# Patient Record
Sex: Female | Born: 1940 | Race: Black or African American | Hispanic: No | Marital: Married | Smoking: Never smoker
Health system: Southern US, Community
[De-identification: ages and names within clinical notes are randomized; demographics above are authoritative.]

## PROBLEM LIST (undated history)

## (undated) DIAGNOSIS — N951 Menopausal and female climacteric states: Secondary | ICD-10-CM

## (undated) DIAGNOSIS — M810 Age-related osteoporosis without current pathological fracture: Secondary | ICD-10-CM

## (undated) DIAGNOSIS — M545 Low back pain: Secondary | ICD-10-CM

## (undated) DIAGNOSIS — E119 Type 2 diabetes mellitus without complications: Secondary | ICD-10-CM

## (undated) DIAGNOSIS — M1990 Unspecified osteoarthritis, unspecified site: Secondary | ICD-10-CM

## (undated) DIAGNOSIS — E785 Hyperlipidemia, unspecified: Secondary | ICD-10-CM

## (undated) DIAGNOSIS — D126 Benign neoplasm of colon, unspecified: Secondary | ICD-10-CM

## (undated) DIAGNOSIS — M25559 Pain in unspecified hip: Secondary | ICD-10-CM

## (undated) DIAGNOSIS — I10 Essential (primary) hypertension: Secondary | ICD-10-CM

## (undated) DIAGNOSIS — K5730 Diverticulosis of large intestine without perforation or abscess without bleeding: Secondary | ICD-10-CM

## (undated) DIAGNOSIS — K219 Gastro-esophageal reflux disease without esophagitis: Secondary | ICD-10-CM

## (undated) DIAGNOSIS — H269 Unspecified cataract: Secondary | ICD-10-CM

## (undated) HISTORY — DX: Low back pain: M54.5

## (undated) HISTORY — DX: Gastro-esophageal reflux disease without esophagitis: K21.9

## (undated) HISTORY — PX: BREAST BIOPSY: SHX20

## (undated) HISTORY — DX: Menopausal and female climacteric states: N95.1

## (undated) HISTORY — DX: Hyperlipidemia, unspecified: E78.5

## (undated) HISTORY — PX: TUBAL LIGATION: SHX77

## (undated) HISTORY — DX: Diverticulosis of large intestine without perforation or abscess without bleeding: K57.30

## (undated) HISTORY — DX: Pain in unspecified hip: M25.559

## (undated) HISTORY — DX: Benign neoplasm of colon, unspecified: D12.6

## (undated) HISTORY — DX: Essential (primary) hypertension: I10

## (undated) HISTORY — DX: Type 2 diabetes mellitus without complications: E11.9

## (undated) HISTORY — DX: Age-related osteoporosis without current pathological fracture: M81.0

---

## 1998-06-16 ENCOUNTER — Other Ambulatory Visit: Admission: RE | Admit: 1998-06-16 | Discharge: 1998-06-16 | Payer: Self-pay | Admitting: Obstetrics & Gynecology

## 1998-08-14 ENCOUNTER — Ambulatory Visit (HOSPITAL_COMMUNITY): Admission: RE | Admit: 1998-08-14 | Discharge: 1998-08-14 | Payer: Self-pay | Admitting: Obstetrics & Gynecology

## 1999-10-28 ENCOUNTER — Other Ambulatory Visit: Admission: RE | Admit: 1999-10-28 | Discharge: 1999-10-28 | Payer: Self-pay | Admitting: Obstetrics & Gynecology

## 1999-10-29 ENCOUNTER — Encounter (INDEPENDENT_AMBULATORY_CARE_PROVIDER_SITE_OTHER): Payer: Self-pay

## 1999-10-29 ENCOUNTER — Other Ambulatory Visit: Admission: RE | Admit: 1999-10-29 | Discharge: 1999-10-29 | Payer: Self-pay | Admitting: Obstetrics & Gynecology

## 2000-08-08 ENCOUNTER — Encounter: Admission: RE | Admit: 2000-08-08 | Discharge: 2000-11-06 | Payer: Self-pay | Admitting: Endocrinology

## 2000-11-08 ENCOUNTER — Other Ambulatory Visit: Admission: RE | Admit: 2000-11-08 | Discharge: 2000-11-08 | Payer: Self-pay | Admitting: Obstetrics & Gynecology

## 2002-02-27 ENCOUNTER — Other Ambulatory Visit: Admission: RE | Admit: 2002-02-27 | Discharge: 2002-02-27 | Payer: Self-pay | Admitting: Obstetrics & Gynecology

## 2003-05-14 ENCOUNTER — Other Ambulatory Visit: Admission: RE | Admit: 2003-05-14 | Discharge: 2003-05-14 | Payer: Self-pay | Admitting: Obstetrics & Gynecology

## 2004-05-18 ENCOUNTER — Other Ambulatory Visit: Admission: RE | Admit: 2004-05-18 | Discharge: 2004-05-18 | Payer: Self-pay | Admitting: Obstetrics & Gynecology

## 2005-04-13 ENCOUNTER — Ambulatory Visit: Payer: Self-pay | Admitting: Endocrinology

## 2005-04-19 ENCOUNTER — Ambulatory Visit: Payer: Self-pay | Admitting: Endocrinology

## 2005-06-22 ENCOUNTER — Other Ambulatory Visit: Admission: RE | Admit: 2005-06-22 | Discharge: 2005-06-22 | Payer: Self-pay | Admitting: Obstetrics & Gynecology

## 2006-05-03 ENCOUNTER — Ambulatory Visit: Payer: Self-pay | Admitting: Endocrinology

## 2006-05-03 LAB — CONVERTED CEMR LAB
AST: 24 units/L (ref 0–37)
Calcium: 9.5 mg/dL (ref 8.4–10.5)
Chloride: 108 meq/L (ref 96–112)
Creatinine, Ser: 1 mg/dL (ref 0.4–1.2)
GFR calc non Af Amer: 60 mL/min
Glomerular Filtration Rate, Af Am: 72 mL/min/{1.73_m2}
HCT: 38.3 % (ref 36.0–46.0)
Ketones, ur: NEGATIVE mg/dL
LDL Cholesterol: 75 mg/dL (ref 0–99)
Platelets: 288 10*3/uL (ref 150–400)
RDW: 16.1 % — ABNORMAL HIGH (ref 11.5–14.6)
Specific Gravity, Urine: 1.02 (ref 1.000–1.03)
TSH: 1.28 microintl units/mL (ref 0.35–5.50)
Total Bilirubin: 0.8 mg/dL (ref 0.3–1.2)
Total Protein, Urine: NEGATIVE mg/dL
Total Protein: 6.6 g/dL (ref 6.0–8.3)
Triglyceride fasting, serum: 45 mg/dL (ref 0–149)
VLDL: 9 mg/dL (ref 0–40)
WBC: 6.6 10*3/uL (ref 4.5–10.5)
pH: 6 (ref 5.0–8.0)

## 2006-05-10 ENCOUNTER — Ambulatory Visit: Payer: Self-pay | Admitting: Endocrinology

## 2006-05-18 ENCOUNTER — Ambulatory Visit: Payer: Self-pay | Admitting: Internal Medicine

## 2006-07-05 ENCOUNTER — Ambulatory Visit: Payer: Self-pay | Admitting: Gastroenterology

## 2006-07-20 ENCOUNTER — Encounter: Payer: Self-pay | Admitting: Gastroenterology

## 2006-07-20 ENCOUNTER — Ambulatory Visit: Payer: Self-pay | Admitting: Gastroenterology

## 2006-07-20 DIAGNOSIS — K5730 Diverticulosis of large intestine without perforation or abscess without bleeding: Secondary | ICD-10-CM | POA: Insufficient documentation

## 2006-07-20 HISTORY — DX: Diverticulosis of large intestine without perforation or abscess without bleeding: K57.30

## 2006-07-20 LAB — HM COLONOSCOPY

## 2007-04-19 ENCOUNTER — Encounter: Payer: Self-pay | Admitting: *Deleted

## 2007-04-19 DIAGNOSIS — M810 Age-related osteoporosis without current pathological fracture: Secondary | ICD-10-CM | POA: Insufficient documentation

## 2007-04-19 DIAGNOSIS — I10 Essential (primary) hypertension: Secondary | ICD-10-CM | POA: Insufficient documentation

## 2007-04-19 DIAGNOSIS — E119 Type 2 diabetes mellitus without complications: Secondary | ICD-10-CM | POA: Insufficient documentation

## 2007-04-19 DIAGNOSIS — E785 Hyperlipidemia, unspecified: Secondary | ICD-10-CM

## 2007-04-19 DIAGNOSIS — N951 Menopausal and female climacteric states: Secondary | ICD-10-CM

## 2007-04-19 HISTORY — DX: Type 2 diabetes mellitus without complications: E11.9

## 2007-04-19 HISTORY — DX: Menopausal and female climacteric states: N95.1

## 2007-04-19 HISTORY — DX: Hyperlipidemia, unspecified: E78.5

## 2007-04-19 HISTORY — DX: Age-related osteoporosis without current pathological fracture: M81.0

## 2007-04-19 HISTORY — DX: Essential (primary) hypertension: I10

## 2007-05-08 ENCOUNTER — Ambulatory Visit: Payer: Self-pay | Admitting: Endocrinology

## 2007-05-08 LAB — CONVERTED CEMR LAB
ALT: 12 units/L (ref 0–35)
AST: 18 units/L (ref 0–37)
Albumin: 3.5 g/dL (ref 3.5–5.2)
Alkaline Phosphatase: 63 units/L (ref 39–117)
BUN: 12 mg/dL (ref 6–23)
Bilirubin Urine: NEGATIVE
Bilirubin, Direct: 0.1 mg/dL (ref 0.0–0.3)
Calcium: 9.4 mg/dL (ref 8.4–10.5)
Cholesterol: 184 mg/dL (ref 0–200)
Creatinine, Ser: 0.9 mg/dL (ref 0.4–1.2)
Creatinine,U: 62.5 mg/dL
Crystals: NEGATIVE
GFR calc non Af Amer: 67 mL/min
HDL: 59.7 mg/dL (ref >39.0)
Hemoglobin: 12.8 g/dL (ref 12.0–15.0)
LDL Cholesterol: 113 mg/dL — ABNORMAL HIGH (ref 0–99)
Leukocytes, UA: NEGATIVE
Lymphocytes Relative: 31.7 % (ref 12.0–46.0)
MCHC: 33.6 g/dL (ref 30.0–36.0)
MCV: 77 fL — ABNORMAL LOW (ref 78.0–100.0)
Monocytes Relative: 9.6 % (ref 3.0–11.0)
Nitrite: NEGATIVE
Platelets: 221 10*3/uL (ref 150–400)
RDW: 14.5 % (ref 11.5–14.6)
Total Bilirubin: 0.8 mg/dL (ref 0.3–1.2)
Total CHOL/HDL Ratio: 3.1
Total Protein, Urine: NEGATIVE mg/dL
Total Protein: 6.7 g/dL (ref 6.0–8.3)
VLDL: 12 mg/dL (ref 0–40)

## 2007-05-17 ENCOUNTER — Ambulatory Visit: Payer: Self-pay | Admitting: Endocrinology

## 2007-06-18 ENCOUNTER — Ambulatory Visit: Payer: Self-pay | Admitting: Gastroenterology

## 2007-06-26 ENCOUNTER — Ambulatory Visit: Payer: Self-pay | Admitting: Gastroenterology

## 2007-08-21 DIAGNOSIS — K219 Gastro-esophageal reflux disease without esophagitis: Secondary | ICD-10-CM

## 2007-08-21 DIAGNOSIS — D126 Benign neoplasm of colon, unspecified: Secondary | ICD-10-CM

## 2007-08-21 HISTORY — DX: Gastro-esophageal reflux disease without esophagitis: K21.9

## 2007-08-21 HISTORY — DX: Benign neoplasm of colon, unspecified: D12.6

## 2007-11-30 ENCOUNTER — Encounter: Payer: Self-pay | Admitting: Endocrinology

## 2008-05-12 ENCOUNTER — Ambulatory Visit: Payer: Self-pay | Admitting: Endocrinology

## 2008-05-14 LAB — CONVERTED CEMR LAB
AST: 18 units/L (ref 0–37)
Albumin: 3.5 g/dL (ref 3.5–5.2)
Alkaline Phosphatase: 61 units/L (ref 39–117)
BUN: 9 mg/dL (ref 6–23)
Bilirubin, Direct: 0.1 mg/dL (ref 0.0–0.3)
CO2: 29 meq/L (ref 19–32)
Calcium: 9.7 mg/dL (ref 8.4–10.5)
Chloride: 107 meq/L (ref 96–112)
Cholesterol: 259 mg/dL (ref 0–200)
Creatinine, Ser: 0.9 mg/dL (ref 0.4–1.2)
Direct LDL: 159.2 mg/dL
Eosinophils Relative: 1.3 % (ref 0.0–5.0)
GFR calc non Af Amer: 67 mL/min
Ketones, ur: NEGATIVE mg/dL
Monocytes Relative: 8.2 % (ref 3.0–12.0)
Specific Gravity, Urine: 1.01 (ref 1.000–1.03)
TSH: 1.22 microintl units/mL (ref 0.35–5.50)
Total CHOL/HDL Ratio: 4.6
Total Protein: 6.7 g/dL (ref 6.0–8.3)
Triglycerides: 85 mg/dL (ref 0–149)
Urine Glucose: NEGATIVE mg/dL
Urobilinogen, UA: 0.2 (ref 0.0–1.0)
pH: 6.5 (ref 5.0–8.0)

## 2008-05-19 ENCOUNTER — Ambulatory Visit: Payer: Self-pay | Admitting: Endocrinology

## 2008-05-27 ENCOUNTER — Encounter: Payer: Self-pay | Admitting: Endocrinology

## 2008-05-27 ENCOUNTER — Ambulatory Visit: Payer: Self-pay | Admitting: Internal Medicine

## 2008-08-11 ENCOUNTER — Ambulatory Visit: Payer: Self-pay | Admitting: Endocrinology

## 2008-08-11 LAB — CONVERTED CEMR LAB
ALT: 11 units/L (ref 0–35)
Bilirubin, Direct: 0.1 mg/dL (ref 0.0–0.3)
Cholesterol: 165 mg/dL (ref 0–200)
HDL: 55 mg/dL (ref >39.0)
Hgb A1c MFr Bld: 6.2 % — ABNORMAL HIGH (ref 4.6–6.0)
LDL Cholesterol: 96 mg/dL (ref 0–99)
Total Protein: 7.1 g/dL (ref 6.0–8.3)
Triglycerides: 69 mg/dL (ref 0–149)
VLDL: 14 mg/dL (ref 0–40)

## 2008-08-18 ENCOUNTER — Ambulatory Visit: Payer: Self-pay | Admitting: Endocrinology

## 2008-12-01 ENCOUNTER — Encounter: Payer: Self-pay | Admitting: Endocrinology

## 2009-05-15 ENCOUNTER — Ambulatory Visit: Payer: Self-pay | Admitting: Endocrinology

## 2009-05-15 LAB — CONVERTED CEMR LAB
ALT: 15 units/L (ref 0–35)
Albumin: 3.5 g/dL (ref 3.5–5.2)
Alkaline Phosphatase: 75 units/L (ref 39–117)
BUN: 15 mg/dL (ref 6–23)
Bilirubin, Direct: 0.1 mg/dL (ref 0.0–0.3)
CO2: 29 meq/L (ref 19–32)
Calcium: 9.4 mg/dL (ref 8.4–10.5)
Cholesterol: 177 mg/dL (ref 0–200)
Creatinine, Ser: 1 mg/dL (ref 0.4–1.2)
Eosinophils Relative: 1.1 % (ref 0.0–5.0)
GFR calc non Af Amer: 71.32 mL/min (ref >60)
Hemoglobin, Urine: NEGATIVE
Hemoglobin: 13.6 g/dL (ref 12.0–15.0)
Hgb A1c MFr Bld: 6.4 % (ref 4.6–6.5)
Lymphocytes Relative: 24.8 % (ref 12.0–46.0)
MCHC: 33.4 g/dL (ref 30.0–36.0)
RDW: 14.4 % (ref 11.5–14.6)
Sodium: 144 meq/L (ref 135–145)
Specific Gravity, Urine: 1.02 (ref 1.000–1.030)
TSH: 1.56 microintl units/mL (ref 0.35–5.50)
Total Protein, Urine: NEGATIVE mg/dL
Triglycerides: 45 mg/dL (ref 0.0–149.0)
Urobilinogen, UA: 0.2 (ref 0.0–1.0)

## 2009-05-21 ENCOUNTER — Ambulatory Visit: Payer: Self-pay | Admitting: Endocrinology

## 2009-05-21 DIAGNOSIS — M545 Low back pain, unspecified: Secondary | ICD-10-CM

## 2009-05-21 HISTORY — DX: Low back pain, unspecified: M54.50

## 2009-08-25 LAB — CONVERTED CEMR LAB: Pap Smear: NORMAL

## 2009-12-07 ENCOUNTER — Encounter: Payer: Self-pay | Admitting: Endocrinology

## 2010-05-17 ENCOUNTER — Ambulatory Visit: Payer: Self-pay | Admitting: Endocrinology

## 2010-05-17 LAB — CONVERTED CEMR LAB
ALT: 16 units/L (ref 0–35)
AST: 20 units/L (ref 0–37)
Alkaline Phosphatase: 71 units/L (ref 39–117)
BUN: 11 mg/dL (ref 6–23)
Basophils Absolute: 0 10*3/uL (ref 0.0–0.1)
Basophils Relative: 0.7 % (ref 0.0–3.0)
Chloride: 101 meq/L (ref 96–112)
Creatinine, Ser: 0.9 mg/dL (ref 0.4–1.2)
GFR calc non Af Amer: 85.77 mL/min (ref >60)
Glucose, Bld: 123 mg/dL — ABNORMAL HIGH (ref 70–99)
HCT: 41.9 % (ref 36.0–46.0)
Hemoglobin: 14.1 g/dL (ref 12.0–15.0)
Ketones, ur: NEGATIVE mg/dL
LDL Cholesterol: 84 mg/dL (ref 0–99)
Leukocytes, UA: NEGATIVE
Lymphocytes Relative: 22.9 % (ref 12.0–46.0)
MCHC: 33.6 g/dL (ref 30.0–36.0)
MCV: 79.9 fL (ref 78.0–100.0)
Monocytes Relative: 7.3 % (ref 3.0–12.0)
Neutro Abs: 4.1 10*3/uL (ref 1.4–7.7)
Neutrophils Relative %: 67.8 % (ref 43.0–77.0)
RDW: 15.4 % — ABNORMAL HIGH (ref 11.5–14.6)
Total Bilirubin: 0.6 mg/dL (ref 0.3–1.2)
Urobilinogen, UA: 0.2 (ref 0.0–1.0)
WBC: 6.1 10*3/uL (ref 4.5–10.5)

## 2010-05-24 ENCOUNTER — Ambulatory Visit: Payer: Self-pay | Admitting: Endocrinology

## 2010-05-24 ENCOUNTER — Encounter: Payer: Self-pay | Admitting: Endocrinology

## 2010-05-24 DIAGNOSIS — M25559 Pain in unspecified hip: Secondary | ICD-10-CM

## 2010-05-24 HISTORY — DX: Pain in unspecified hip: M25.559

## 2010-06-02 ENCOUNTER — Ambulatory Visit: Payer: Self-pay | Admitting: Internal Medicine

## 2010-06-02 ENCOUNTER — Encounter: Payer: Self-pay | Admitting: Endocrinology

## 2010-08-24 NOTE — Letter (Signed)
Summary: [facility]  [facility]   Imported By: Lester Gifford 12/10/2009 09:59:28  _____________________________________________________________________  External Attachment:    Type:   Image     Comment:   External Document

## 2010-08-24 NOTE — Assessment & Plan Note (Signed)
Summary: YEARLY FU ON DM/NWS  #   Vital Signs:  Patient profile:   70 year old female Height:      65 inches (165.10 cm) Weight:      161.13 pounds (73.24 kg) BMI:     26.91 O2 Sat:      94 % on Room air Temp:     98.7 degrees F (37.06 degrees C) oral Pulse rate:   95 / minute BP sitting:   112 / 72  (left arm) Cuff size:   regular  Vitals Entered By: Brenton Grills CMA Duncan Dull) (May 24, 2010 8:39 AM)  O2 Flow:  Room air CC: Yearly F/U/aj Is Patient Diabetic? Yes   CC:  Yearly F/U/aj.  History of Present Illness: here for regular wellness examination.  she's feeling pretty well in general, and does not smoke.  alcohol is rare.   Current Medications (verified): 1)  Glucophage 500 Mg  Tabs (Metformin Hcl) .... Take 2 By Mouth Two Times A Day Qd 2)  Procardia Xl 30 Mg  Tb24 (Nifedipine) .... Take 1 By Mouth Qd 3)  Aspirin 325 Mg  Tbec (Aspirin) .... Take 1 By Mouth Qd 4)  Januvia 100 Mg  Tabs (Sitagliptin Phosphate) .... Take 1 By Mouth Qd 5)  Lisinopril 40 Mg  Tabs (Lisinopril) .... Take 1 By Mouth Qd 6)  Colestipol Hcl 1 Gm Tabs (Colestipol Hcl) .... 3 Qd 7)  Accu-Chek Compact Test Drum  Strp (Glucose Blood) .... Two Times A Day, and Lancets 250.00 8)  Simvastatin 40 Mg Tabs (Simvastatin) .... 2 At Bedtime  Allergies (verified): 1)  ! Zestril  Past History:  Past Surgical History: Breast biopsy 1990's--benign  Family History: Reviewed history from 05/19/2008 and no changes required. no cancer  Social History: Reviewed history from 05/19/2008 and no changes required. married works Biomedical engineer for [employer]  Review of Systems  The patient denies fever, weight loss, weight gain, vision loss, decreased hearing, chest pain, syncope, dyspnea on exertion, prolonged cough, headaches, abdominal pain, melena, hematochezia, severe indigestion/heartburn, hematuria, suspicious skin lesions, and depression.    Physical Exam  General:  normal appearance.   Head:   head: no deformity eyes: no periorbital swelling, no proptosis external nose and ears are normal mouth: no lesion seen Neck:  Supple without thyroid enlargement or tenderness.  Breasts:  sees gyn  Lungs:  Clear to auscultation bilaterally. Normal respiratory effort.  Heart:  Regular rate and rhythm without murmurs or gallops noted. Normal S1,S2.   Abdomen:  abdomen is soft, nontender.  no hepatosplenomegaly.   not distended.  there is a self-reducing ventral hernia Rectal:  sees gyn  Genitalia:  sees gyn  Msk:  muscle bulk and strength are grossly normal.  no obvious joint swelling.  Pulses:  dorsalis pedis intact bilat.  no carotid bruit  Extremities:  no deformity.  no ulcer on the feet.  feet are of normal color and temp.  no edema  Neurologic:  cn 2-12 grossly intact.   readily moves all 4's.     Skin:  normal texture and temp.  no rash.  not diaphoretic  Cervical Nodes:  No significant adenopathy.  Psych:  Alert and cooperative; normal mood and affect; normal attention span and concentration.   Additional Exam:  SEPARATE EVALUATION FOLLOWS--EACH PROBLEM HERE IS NEW, NOT RESPONDING TO TREATMENT, OR POSES SIGNIFICANT RISK TO THE PATIENT'S HEALTH: HISTORY OF THE PRESENT ILLNESS: pt states 1-2 years of intermitent mild pain at the right hip.  no assoc numbness. PAST MEDICAL HISTORY reviewed and up to date today REVIEW OF SYSTEMS: she does not have arthralgias in general PHYSICAL EXAMINATION: ex: right hip is nontender.   gait: normal sensation is intact to touch on the feet LAB/XRAY RESULTS: x ray result is noted from last year IMPRESSION: persistent hip pain PLAN: see instruction sheet   Impression & Recommendations:  Problem # 1:  ROUTINE GENERAL MEDICAL EXAM@HEALTH  CARE FACL (ICD-V70.0)  Medications Added to Medication List This Visit: 1)  Tramadol Hcl 50 Mg Tabs (Tramadol hcl) .Marland Kitchen.. 1 every 4 hrs as needed for pain  Other Orders: EKG w/ Interpretation  (93000) T-Bone Densitometry (54098) Est. Patient Level III (11914) Est. Patient 65& > (78295)  Patient Instructions: 1)  please consider these measures for your health:  minimize alcohol.  do not use tobacco products.  have a colonoscopy at least every 10 years from age 66.  keep firearms safely stored.  always use seat belts.  have working smoke alarms in your home.  see an eye doctor and dentist regularly.  never drive under the influence of alcohol or drugs (including prescription drugs).  2)  please let me know what your wishes would be, if artificial life support measures should become necessary.  it is critically important to prevent falling down (keep floor areas well-lit, dry, and free of loose objects) 3)  go to lab in 6 months for a1c 250.00. 4)  Please schedule a follow-up appointment in 1 year. 5)  take tramadol 1 every 4 hrs as needed for pain.  call if you wish to see an orthopedic specialist.   Prescriptions: TRAMADOL HCL 50 MG TABS (TRAMADOL HCL) 1 every 4 hrs as needed for pain  #50 x 3   Entered and Authorized by:   Minus Breeding MD   Signed by:   Minus Breeding MD on 05/24/2010   Method used:   Electronically to        [facility] [phone]* (retail)       [address]       [place], [place]  [address]       Ph: [phone]       Fax: [phone]   RxID:   [id number]    Orders Added: 1)  EKG w/ Interpretation [93000] 2)  T-Bone Densitometry [77080] 3)  Est. Patient Level III [63875] 4)  Est. Patient 65& > [64332]     Preventive Care Screening  Mammogram:    Date:  08/25/2009    Results:  normal   Pap Smear:    Date:  08/25/2009    Results:  normal      gyn is dr Aldona Bar   Preventive Care Screening  Mammogram:    Date:  08/25/2009    Results:  normal   Pap Smear:    Date:  08/25/2009    Results:  normal      gyn is dr Aldona Bar  Appended Document: YEARLY FU ON DM/NWS  # we discussed code status.  pt requests full code, but would not  want to be started or maintained on artificial life-support measures if there was not a reasonable chance of recovery

## 2010-08-24 NOTE — Miscellaneous (Signed)
Summary: BONE DENSITY  Clinical Lists Changes  Orders: Added new Test order of T-Lumbar Vertebral Assessment (77082) - Signed 

## 2010-11-22 ENCOUNTER — Other Ambulatory Visit: Payer: Self-pay | Admitting: Endocrinology

## 2010-11-22 ENCOUNTER — Other Ambulatory Visit (INDEPENDENT_AMBULATORY_CARE_PROVIDER_SITE_OTHER): Payer: BC Managed Care – PPO

## 2010-11-22 DIAGNOSIS — E119 Type 2 diabetes mellitus without complications: Secondary | ICD-10-CM

## 2010-11-22 LAB — HEMOGLOBIN A1C: Hgb A1c MFr Bld: 6.9 % — ABNORMAL HIGH (ref 4.6–6.5)

## 2010-12-07 NOTE — Assessment & Plan Note (Signed)
[facility]                         GASTROENTEROLOGY OFFICE NOTE   Lindsey, Jenkins                         MRN:          [id number]  DATE:06/18/2007                            DOB:          24-Jan-1941    REASON FOR CONSULTATION:  Regurgitation.     Lindsey Jenkins is a pleasant 70 year old African-American female referred  through the courtesy of Dr. Everardo All.  She has been suffering from  burning chest discomfort with frequent regurgitation of gastric  contents.  She is on no gastric irritants other than nonsteroidals.  She  denies dysphagia, sore throat, or cough.  Symptoms have been long-  standing.   PAST MEDICAL HISTORY:  Pertinent for diabetes and hypertension.  She is  status post tubal ligation.  She has a history of adenomatous colon  polyp.   FAMILY HISTORY:  Pertinent for mother and 3 siblings with diabetes.   MEDICATIONS:  Actos.  Glucophage.  Aspirin.  Lisinopril.  Januvia.  Metformin.  Nifedical.  Crestor.   She has no allergies.   She neither smokes nor drinks.  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   REVIEW OF SYSTEMS:  Positive for crampy leg pain.   EXAM:  Pulse 84, blood pressure 130/78, weight 162.  HEENT:  EOMI. PERRLA. Sclerae are anicteric.  Conjunctivae are pink.  NECK:  Supple without thyromegaly, adenopathy or carotid bruits.  CHEST:  Clear to auscultation and percussion without adventitious  sounds.  CARDIAC:  Regular rhythm; normal S1 S2.  There are no murmurs, gallops  or rubs.  ABDOMEN:  Bowel sounds are normoactive.  Abdomen is soft, non-tender and  non-distended.  There are no abdominal masses, tenderness, splenic  enlargement or hepatomegaly.  EXTREMITIES:  Full range of motion.  No cyanosis, clubbing or edema.  RECTAL:  Deferred.   IMPRESSION:  1. Gastroesophageal reflux disease with long-standing symptoms.      Barrett's esophagus ought to be ruled out.  She is also at high      risk for  candida esophagitis in view of her diabetes.  2. Diabetes.  3. Hypertension.  4. History of colon polyps.   RECOMMENDATION:  1. Trial of Nexium 40 mg a day.  2. Upper endoscopy.  3. Followup colonoscopy in 2013.     Barbette Hair. Arlyce Dice, MD,FACG  Electronically Signed    RDK/MedQ  DD: 06/18/2007  DT: 06/18/2007  Job #: [id number]

## 2011-02-16 ENCOUNTER — Ambulatory Visit (INDEPENDENT_AMBULATORY_CARE_PROVIDER_SITE_OTHER): Payer: BC Managed Care – PPO | Admitting: Endocrinology

## 2011-02-16 ENCOUNTER — Telehealth: Payer: Self-pay | Admitting: *Deleted

## 2011-02-16 ENCOUNTER — Encounter: Payer: Self-pay | Admitting: Endocrinology

## 2011-02-16 VITALS — BP 124/82 | HR 86 | Temp 98.5°F | Ht 65.0 in | Wt 160.6 lb

## 2011-02-16 DIAGNOSIS — L03116 Cellulitis of left lower limb: Secondary | ICD-10-CM

## 2011-02-16 DIAGNOSIS — L02419 Cutaneous abscess of limb, unspecified: Secondary | ICD-10-CM

## 2011-02-16 DIAGNOSIS — L03119 Cellulitis of unspecified part of limb: Secondary | ICD-10-CM

## 2011-02-16 MED ORDER — DOXYCYCLINE HYCLATE 100 MG PO TABS: 100.0000 mg | ORAL_TABLET | Freq: Two times a day (BID) | ORAL | Status: AC

## 2011-02-16 MED ORDER — TRAMADOL HCL 50 MG PO TABS: 50.0000 mg | ORAL_TABLET | ORAL | Status: DC | PRN

## 2011-02-16 NOTE — Progress Notes (Signed)
  Subjective:    Patient ID: Lindsey Jenkins, female    DOB: 1940/11/09, 70 y.o.   MRN: [id number]  HPI Pt struck her left leg on furniture, 3 weeks ago.  Since then, she has a few weeks of moderate redness there, and assoc drainage Past Medical History  Diagnosis Date  . 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 DIABETES MELLITUS, TYPE II 04/19/2007  . HYPERLIPIDEMIA 04/19/2007  . HYPERTENSION 04/19/2007  . GASTROESOPHAGEAL REFLUX DISEASE 08/21/2007  . DIVERTICULOSIS, COLON 07/20/2006  . MENOPAUSAL SYNDROME 04/19/2007  . OSTEOPOROSIS 04/19/2007  . BACK PAIN, LUMBAR 05/21/2009  . HIP PAIN 05/24/2010    Past Surgical History  Procedure Date  . Breast biopsy 1990's    benign    History   Social History  . Marital Status: Married    Spouse Name: N/A    Number of Children: N/A  . 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 Smoking status: Never Smoker   . Smokeless tobacco: Not on file  . Alcohol Use: Not on file  . Drug Use: Not on file  . Sexually Active: Not on file   Other Topics Concern  . Not on file   Social History Narrative  . No narrative on file    No current outpatient prescriptions on file prior to visit.    No Active Allergies  Family History  Problem Relation Age of Onset  . Cancer Neg Hx     BP 124/82  Pulse 86  Temp(Src) 98.5 F (36.9 C) (Oral)  Ht 5\' 5"  (1.651 m)  Wt 160 lb 9.6 oz (72.848 kg)  BMI 26.73 kg/m2  SpO2 95% Review of Systems Denies fever.  She says the leg is painful    Objective:   Physical Exam GENERAL: no distress Left ant tibial area: 5 cm area of erythema/swelling/warmth/tenderness.  There is a central area of slight drainage, but no ulcer.      Assessment & Plan:  Cellulitis, new

## 2011-02-16 NOTE — Patient Instructions (Signed)
i have sent a prescription to your pharmacy, for an antibiotic Return here if this gets any worse. The best treatment for this is to elevate your left leg.  It must be the highest part of your body.

## 2011-02-16 NOTE — Telephone Encounter (Signed)
Pt called & made appointment w/scheduler today @ 11:15am [Called home number and got updated phone numbers for Pt's mobile & work]. Called Pt's work number to assess leg injury with "oozing" [ability to control bleeding, location of injury on leg, weakness, fatigue, lightheadedness/dizziness, confusion, rapid heart rate, low blood pressure, nausea]. Awaiting call back form Pt.

## 2011-02-28 NOTE — Telephone Encounter (Signed)
Pt seen by MD 02/16/11.

## 2011-03-01 ENCOUNTER — Telehealth: Payer: Self-pay

## 2011-03-01 NOTE — Telephone Encounter (Signed)
Pt advised but no appt available pt will call back for same day thurs (next available) or call back tomorrow for any available MD.

## 2011-03-01 NOTE — Telephone Encounter (Signed)
Pt called stating she completed ABX she was prescribed for cellulitis at last OV but redness and swelling has returned. Pt is requesting refill of ABX, please advise.

## 2011-03-01 NOTE — Telephone Encounter (Signed)
This needs to be rechecked.  Ov today please

## 2011-03-03 ENCOUNTER — Encounter: Payer: Self-pay | Admitting: Endocrinology

## 2011-03-03 ENCOUNTER — Ambulatory Visit (INDEPENDENT_AMBULATORY_CARE_PROVIDER_SITE_OTHER): Payer: BC Managed Care – PPO | Admitting: Endocrinology

## 2011-03-03 DIAGNOSIS — L03116 Cellulitis of left lower limb: Secondary | ICD-10-CM

## 2011-03-03 DIAGNOSIS — L02419 Cutaneous abscess of limb, unspecified: Secondary | ICD-10-CM

## 2011-03-03 DIAGNOSIS — E119 Type 2 diabetes mellitus without complications: Secondary | ICD-10-CM

## 2011-03-03 MED ORDER — TRIAMCINOLONE ACETONIDE 0.025 % EX CREA: TOPICAL_CREAM | Freq: Three times a day (TID) | CUTANEOUS | Status: AC

## 2011-03-03 NOTE — Patient Instructions (Addendum)
i have sent a prescription to your pharmacy, for an anti-itch cream. Return here if this gets any worse. The nifedipine is probably leaving you at risk for swelling.  When you come in for your physical, we can change it to another med if you wish.

## 2011-03-03 NOTE — Progress Notes (Signed)
  Subjective:    Patient ID: Lindsey Jenkins, female    DOB: 1941-01-18, 70 y.o.   MRN: [id number]  HPI Since ov here, pt says her left leg is improved, but no improved as much as she thinks it should be by now.  She has slight itching there.  She says cbg's are well-controlled.   She report 1-2 weeks of slight swelling of the ankles, left > right.  No assoc sob Past Medical History  Diagnosis Date  . 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 DIABETES MELLITUS, TYPE II 04/19/2007  . HYPERLIPIDEMIA 04/19/2007  . HYPERTENSION 04/19/2007  . GASTROESOPHAGEAL REFLUX DISEASE 08/21/2007  . DIVERTICULOSIS, COLON 07/20/2006  . MENOPAUSAL SYNDROME 04/19/2007  . OSTEOPOROSIS 04/19/2007  . BACK PAIN, LUMBAR 05/21/2009  . HIP PAIN 05/24/2010    Past Surgical History  Procedure Date  . Breast biopsy 1990's    benign    History   Social History  . Marital Status: Married    Spouse Name: N/A    Number of Children: N/A  . 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 Smoking status: Never Smoker   . Smokeless tobacco: Not on file  . Alcohol Use: Not on file  . Drug Use: Not on file  . Sexually Active: Not on file   Other Topics Concern  . Not on file   Social History Narrative  . No narrative on file    Current Outpatient Prescriptions on File Prior to Visit  Medication Sig Dispense Refill  . aspirin 325 MG tablet Take 325 mg by mouth daily.        . colestipol (COLESTID) 1 G tablet Take 3 g by mouth daily.        Marland Kitchen glucose blood (ACCU-CHEK COMPACT TEST DRUM) test strip Two times a day and lancets dx 250.00       . lisinopril (PRINIVIL,ZESTRIL) 40 MG tablet Take 40 mg by mouth daily.        . metFORMIN (GLUCOPHAGE) 500 MG tablet Take 2 tablets by mouth two times a day       . NIFEdipine (PROCARDIA XL/ADALAT-CC) 30 MG 24 hr tablet Take 30 mg by mouth daily.        . simvastatin (ZOCOR) 40 MG tablet 2 tablets at  bedtime       . sitaGLIPtin (JANUVIA) 100 MG tablet Take 100 mg by mouth daily.        . traMADol (ULTRAM) 50 MG tablet Take 1 tablet (50 mg total) by mouth every 4 (four) hours as needed. For pain  50 tablet  5   No Known Allergies  Family History  Problem Relation Age of Onset  . Cancer Neg Hx    BP 134/80  Pulse 93  Temp(Src) 98.7 F (37.1 C) (Oral)  Ht 5\' 5"  (1.651 m)  Wt 160 lb 3.2 oz (72.666 kg)  BMI 26.66 kg/m2  SpO2 98%  Review of Systems Denies fever and numbness    Objective:   Physical Exam GENERAL: no distress Left ant tibial area:  There is 3 cm of slight soft-tissue swelling, but no tenderness, open ulcer, or drainage.   Ankles: i do not detect any swelling.     Assessment & Plan:  Edema, new.  prob due to nifedipine. Leg ulcer, improved but not resolved.

## 2011-05-12 ENCOUNTER — Telehealth: Payer: Self-pay | Admitting: *Deleted

## 2011-05-12 DIAGNOSIS — Z0000 Encounter for general adult medical examination without abnormal findings: Secondary | ICD-10-CM

## 2011-05-12 DIAGNOSIS — E119 Type 2 diabetes mellitus without complications: Secondary | ICD-10-CM

## 2011-05-12 NOTE — Telephone Encounter (Signed)
Labs placed into Epic for Upcoming CPX

## 2011-05-12 NOTE — Telephone Encounter (Signed)
Message copied by Carin Primrose on Thu May 12, 2011  1:38 PM ------      Message from: COUSIN, [place] T      Created: Tue May 10, 2011 11:55 AM      Regarding: 05/26/11 @ 830 PHY       THANKS

## 2011-05-16 ENCOUNTER — Other Ambulatory Visit (INDEPENDENT_AMBULATORY_CARE_PROVIDER_SITE_OTHER): Payer: BC Managed Care – PPO

## 2011-05-16 DIAGNOSIS — E119 Type 2 diabetes mellitus without complications: Secondary | ICD-10-CM

## 2011-05-16 DIAGNOSIS — Z0000 Encounter for general adult medical examination without abnormal findings: Secondary | ICD-10-CM

## 2011-05-16 LAB — CBC WITH DIFFERENTIAL/PLATELET
Basophils Relative: 0.6 % (ref 0.0–3.0)
Eosinophils Relative: 1.6 % (ref 0.0–5.0)
HCT: 41.2 % (ref 36.0–46.0)
Hemoglobin: 13.9 g/dL (ref 12.0–15.0)
Lymphs Abs: 1.4 10*3/uL (ref 0.7–4.0)
MCV: 79.5 fl (ref 78.0–100.0)
Monocytes Absolute: 0.4 10*3/uL (ref 0.1–1.0)
Monocytes Relative: 6.9 % (ref 3.0–12.0)
Neutro Abs: 4 10*3/uL (ref 1.4–7.7)
WBC: 6 10*3/uL (ref 4.5–10.5)

## 2011-05-16 LAB — URINALYSIS, ROUTINE W REFLEX MICROSCOPIC
Bilirubin Urine: NEGATIVE
Ketones, ur: NEGATIVE
Total Protein, Urine: NEGATIVE
pH: 5.5 (ref 5.0–8.0)

## 2011-05-16 LAB — HEPATIC FUNCTION PANEL
ALT: 17 U/L (ref 0–35)
AST: 20 U/L (ref 0–37)
Bilirubin, Direct: 0.1 mg/dL (ref 0.0–0.3)
Total Bilirubin: 0.4 mg/dL (ref 0.3–1.2)

## 2011-05-16 LAB — BASIC METABOLIC PANEL: GFR: 78.05 mL/min (ref >60.00)

## 2011-05-16 LAB — TSH: TSH: 1.18 u[IU]/mL (ref 0.35–5.50)

## 2011-05-16 LAB — LDL CHOLESTEROL, DIRECT: Direct LDL: 84.4 mg/dL

## 2011-05-17 ENCOUNTER — Other Ambulatory Visit: Payer: Self-pay | Admitting: Endocrinology

## 2011-05-26 ENCOUNTER — Ambulatory Visit (INDEPENDENT_AMBULATORY_CARE_PROVIDER_SITE_OTHER): Payer: BC Managed Care – PPO | Admitting: Endocrinology

## 2011-05-26 ENCOUNTER — Encounter: Payer: Self-pay | Admitting: Endocrinology

## 2011-05-26 VITALS — BP 132/70 | HR 91 | Temp 98.7°F | Ht 65.0 in | Wt 162.0 lb

## 2011-05-26 DIAGNOSIS — Z2911 Encounter for prophylactic immunotherapy for respiratory syncytial virus (RSV): Secondary | ICD-10-CM

## 2011-05-26 DIAGNOSIS — E119 Type 2 diabetes mellitus without complications: Secondary | ICD-10-CM

## 2011-05-26 DIAGNOSIS — M542 Cervicalgia: Secondary | ICD-10-CM

## 2011-05-26 DIAGNOSIS — Z23 Encounter for immunization: Secondary | ICD-10-CM

## 2011-05-26 MED ORDER — BROMOCRIPTINE MESYLATE 2.5 MG PO TABS: 2.5000 mg | ORAL_TABLET | Freq: Every day | ORAL | Status: DC

## 2011-05-26 NOTE — Progress Notes (Signed)
Subjective:    Patient ID: Lindsey Jenkins, female    DOB: 04-07-1941, 70 y.o.   MRN: [id number]  HPI here for regular wellness examination.  she's feeling pretty well in general, and says chronic med probs are stable, except as noted below. Gyn is dr Aldona Bar. Past Medical History  Diagnosis Date  . 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 DIABETES MELLITUS, TYPE II 04/19/2007  . HYPERLIPIDEMIA 04/19/2007  . HYPERTENSION 04/19/2007  . GASTROESOPHAGEAL REFLUX DISEASE 08/21/2007  . DIVERTICULOSIS, COLON 07/20/2006  . MENOPAUSAL SYNDROME 04/19/2007  . OSTEOPOROSIS 04/19/2007  . BACK PAIN, LUMBAR 05/21/2009  . HIP PAIN 05/24/2010    Past Surgical History  Procedure Date  . Breast biopsy 1990's    benign    History   Social History  . Marital Status: Married    Spouse Name: N/A    Number of Children: N/A  . 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 Smoking status: Never Smoker   . Smokeless tobacco: Not on file  . Alcohol Use: Not on file  . Drug Use: Not on file  . Sexually Active: Not on file   Other Topics Concern  . Not on file   Social History Narrative  . No narrative on file    Current Outpatient Prescriptions on File Prior to Visit  Medication Sig Dispense Refill  . aspirin 325 MG tablet Take 325 mg by mouth daily.        . colestipol (COLESTID) 1 G tablet Take 3 g by mouth daily.        Marland Kitchen glucose blood (ACCU-CHEK COMPACT TEST DRUM) test strip Two times a day and lancets dx 250.00       . JANUVIA 100 MG tablet TAKE 1 TABLET BY MOUTH ONCE DAILY  30 tablet  5  . lisinopril (PRINIVIL,ZESTRIL) 40 MG tablet TAKE 1 TABLET BY MOUTH ONCE DAILY  30 tablet  5  . metFORMIN (GLUCOPHAGE) 500 MG tablet take 2 tablets by mouth twice a day  120 tablet  5  . NIFEdipine (PROCARDIA XL/ADALAT-CC) 30 MG 24 hr tablet Take 30 mg by mouth daily.        . simvastatin (ZOCOR) 40 MG tablet take 2 tablets by mouth at  bedtime  60 tablet  5  . traMADol (ULTRAM) 50 MG tablet Take 1 tablet (50 mg total) by mouth every 4 (four) hours as needed. For pain  50 tablet  5  . triamcinolone (KENALOG) 0.025 % cream Apply topically 3 (three) times daily. Prn for itching  30 g  0   No Known Allergies  Family History  Problem Relation Age of Onset  . Cancer Neg Hx    BP 132/70  Pulse 91  Temp(Src) 98.7 F (37.1 C) (Oral)  Ht 5\' 5"  (1.651 m)  Wt 162 lb (73.483 kg)  BMI 26.96 kg/m2  SpO2 97%  Review of Systems  Constitutional: Negative for fever.  HENT: Negative for hearing loss.   Eyes: Negative for visual disturbance.  Respiratory: Negative for shortness of breath.   Cardiovascular: Negative for chest pain.  Gastrointestinal: Negative for anal bleeding.  Genitourinary: Negative for hematuria.  Musculoskeletal: Negative for gait problem.  Skin: Negative for rash.  Neurological: Negative for tremors.  Hematological: Does not bruise/bleed easily.  Psychiatric/Behavioral: Negative for dysphoric mood.      Objective:   Physical Exam VS: see vs page GEN: no  distress HEAD: head: no deformity eyes: no periorbital swelling, no proptosis external nose and ears are normal mouth: no lesion seen NECK: supple, thyroid is not enlarged CHEST WALL: no deformity LUNGS:  Clear to auscultation BREASTS:  sees gyn CV: reg rate and rhythm, no murmur ABD: abdomen is soft, nontender.  no hepatosplenomegaly.  not distended.  no hernia GENITALIA:  sees gyn RECTAL: sees gyn MUSCULOSKELETAL: muscle bulk and strength are grossly normal.  no obvious joint swelling.  gait is normal and steady EXTEMITIES: no deformity.  no ulcer on the feet.  feet are of normal color and temp.  no edema PULSES: dorsalis pedis intact bilat.  no carotid bruit NEURO:  cn 2-12 grossly intact.   readily moves all 4's.  sensation is intact to touch on the feet SKIN:  Normal texture and temperature.  No rash or suspicious lesion is visible.     NODES:  None palpable at the neck PSYCH: alert, oriented x3.  Does not appear anxious nor depressed.      Assessment & Plan:  Wellness visit today, with problems stable, except as noted.    SEPARATE EVALUATION FOLLOWS--EACH PROBLEM HERE IS NEW, NOT RESPONDING TO TREATMENT, OR POSES SIGNIFICANT RISK TO THE PATIENT'S HEALTH: HISTORY OF THE PRESENT ILLNESS: Pt states a few weeks of slight pain at the right posterior neck, but no assoc numbness PAST MEDICAL HISTORY reviewed and up to date today REVIEW OF SYSTEMS: Denies weight change and loc PHYSICAL EXAMINATION: VITAL SIGNS:  See vs page GENERAL: no distress NECK: There is no palpable thyroid enlargement.  No thyroid nodule is palpable.  No palpable lymphadenopathy at the anterior neck.  Full rom, but rom is painful.   LAB/XRAY RESULTS: Lab Results  Component Value Date   HGBA1C 7.1* 05/16/2011  IMPRESSION: Neck pain, new.  usually due to oa DM: Needs increased rx, if it can be done with a regimen that avoids or minimizes hypoglycemia. PLAN: See instruction page

## 2011-05-26 NOTE — Patient Instructions (Addendum)
please consider these measures for your health:  minimize alcohol.  do not use tobacco products.  have a colonoscopy at least every 10 years from age 68.  Women should have an annual mammogram from age 70.  keep firearms safely stored.  always use seat belts.  have working smoke alarms in your home.  see an eye doctor and dentist regularly.  never drive under the influence of alcohol or drugs (including prescription drugs).   please let me know what your wishes would be, if artificial life support measures should become necessary.  it is critically important to prevent falling down (keep floor areas well-lit, dry, and free of loose objects.  Also, try not to rush). Add bromocriptine 2.5 mg at bedtime.  i have sent a prescription to your pharmacy  For the first week, take just 1/2 pill.  Side-effects of nausea and dizziness go away with time.   Please come back for a follow-up appointment in 3 months Please call if the neck pain persists, and i would be happy to check an x-ray for you.

## 2011-05-29 DIAGNOSIS — M542 Cervicalgia: Secondary | ICD-10-CM | POA: Insufficient documentation

## 2011-06-23 ENCOUNTER — Other Ambulatory Visit: Payer: Self-pay | Admitting: Endocrinology

## 2011-06-29 ENCOUNTER — Encounter: Payer: Self-pay | Admitting: Gastroenterology

## 2011-08-26 ENCOUNTER — Ambulatory Visit: Payer: BC Managed Care – PPO | Admitting: Endocrinology

## 2011-08-29 ENCOUNTER — Encounter: Payer: Self-pay | Admitting: Endocrinology

## 2011-08-29 ENCOUNTER — Ambulatory Visit (INDEPENDENT_AMBULATORY_CARE_PROVIDER_SITE_OTHER): Payer: BC Managed Care – PPO | Admitting: Endocrinology

## 2011-08-29 ENCOUNTER — Ambulatory Visit (INDEPENDENT_AMBULATORY_CARE_PROVIDER_SITE_OTHER)
Admission: RE | Admit: 2011-08-29 | Discharge: 2011-08-29 | Disposition: A | Discharge status: Home / Self Care | Payer: BC Managed Care – PPO | Source: Ambulatory Visit | Attending: Endocrinology | Admitting: Endocrinology

## 2011-08-29 ENCOUNTER — Other Ambulatory Visit (INDEPENDENT_AMBULATORY_CARE_PROVIDER_SITE_OTHER): Payer: BC Managed Care – PPO

## 2011-08-29 VITALS — BP 134/62 | HR 102 | Temp 97.1°F | Ht 65.0 in | Wt 161.0 lb

## 2011-08-29 DIAGNOSIS — M25532 Pain in left wrist: Secondary | ICD-10-CM | POA: Insufficient documentation

## 2011-08-29 DIAGNOSIS — M25539 Pain in unspecified wrist: Secondary | ICD-10-CM

## 2011-08-29 DIAGNOSIS — E119 Type 2 diabetes mellitus without complications: Secondary | ICD-10-CM

## 2011-08-29 LAB — HEMOGLOBIN A1C: Hgb A1c MFr Bld: 7.3 % — ABNORMAL HIGH (ref 4.6–6.5)

## 2011-08-29 MED ORDER — SITAGLIPTIN PHOSPHATE 100 MG PO TABS: 100.0000 mg | ORAL_TABLET | Freq: Every day | ORAL | Status: DC

## 2011-08-29 NOTE — Progress Notes (Signed)
Subjective:    Patient ID: Lindsey Jenkins, female    DOB: [birth year], 71 y.o.   MRN: [id number]  HPI Pt returns for f/u of type 2 DM (2002).  Despite no side-effects from the parlodel, she takes only 1/2 pill per day.  Pt states few mos of moderate pain at the left wrist, but no assoc numbness Past Medical History  Diagnosis Date  . 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 DIABETES MELLITUS, TYPE II 04/19/2007  . HYPERLIPIDEMIA 04/19/2007  . HYPERTENSION 04/19/2007  . GASTROESOPHAGEAL REFLUX DISEASE 08/21/2007  . DIVERTICULOSIS, COLON 07/20/2006  . MENOPAUSAL SYNDROME 04/19/2007  . OSTEOPOROSIS 04/19/2007  . BACK PAIN, LUMBAR 05/21/2009  . HIP PAIN 05/24/2010    Past Surgical History  Procedure Date  . Breast biopsy 1990's    benign    History   Social History  . Marital Status: Married    Spouse Name: N/A    Number of Children: N/A  . 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 Smoking status: Never Smoker   . Smokeless tobacco: Never Used  . Alcohol Use: Not on file  . Drug Use: Not on file  . Sexually Active: Not on file   Other Topics Concern  . Not on file   Social History Narrative  . No narrative on file    Current Outpatient Prescriptions on File Prior to Visit  Medication Sig Dispense Refill  . aspirin 325 MG tablet Take 325 mg by mouth daily.        . bromocriptine (PARLODEL) 2.5 MG tablet Take 1 tablet (2.5 mg total) by mouth daily.  30 tablet  11  . colestipol (COLESTID) 1 G tablet Take 3 g by mouth daily.        Marland Kitchen glucose blood (ACCU-CHEK COMPACT TEST DRUM) test strip Two times a day and lancets dx 250.00       . lisinopril (PRINIVIL,ZESTRIL) 40 MG tablet TAKE 1 TABLET BY MOUTH ONCE DAILY  30 tablet  5  . metFORMIN (GLUCOPHAGE) 500 MG tablet take 2 tablets by mouth twice a day  120 tablet  5  . NIFEDICAL XL 30 MG 24 hr tablet take 1 tablet by mouth once daily  30 tablet  11  .  simvastatin (ZOCOR) 40 MG tablet take 2 tablets by mouth at bedtime  60 tablet  5  . traMADol (ULTRAM) 50 MG tablet Take 1 tablet (50 mg total) by mouth every 4 (four) hours as needed. For pain  50 tablet  5  . triamcinolone (KENALOG) 0.025 % cream Apply topically 3 (three) times daily. Prn for itching  30 g  0    No Known Allergies  Family History  Problem Relation Age of Onset  . Cancer Neg Hx     BP 134/62  Pulse 102  Temp(Src) 97.1 F (36.2 C) (Oral)  Ht 5\' 5"  (1.651 m)  Wt 161 lb (73.029 kg)  BMI 26.79 kg/m2  SpO2 98%   Review of Systems Denies weight change and rash    Objective:   Physical Exam VITAL SIGNS:  See vs page GENERAL: no distress Left wrist: normal   Lab Results  Component Value Date   HGBA1C 7.3* 08/29/2011   (x-ray: mild oa)    Assessment & Plan:  Wrist pain, uncertain etiology DM, Needs increased rx, if it can be done with a regimen that avoids or minimizes hypoglycemia.

## 2011-08-29 NOTE — Patient Instructions (Addendum)
A blood test and an x-ray are being requested for you today.  please call [phone] to hear your test results.  You will be prompted to enter the 9-digit "MRN" number that appears at the top left of this page, followed by #.  Then you will hear the message. Increase bromocriptine to 1 entire pill at bedtime. Please come back for a follow-up appointment in 3 months. (update: i left message on phone-tree:  rx as we discussed)

## 2011-11-29 ENCOUNTER — Other Ambulatory Visit: Payer: Self-pay | Admitting: Endocrinology

## 2011-12-13 ENCOUNTER — Ambulatory Visit: Payer: BC Managed Care – PPO | Admitting: Endocrinology

## 2011-12-31 ENCOUNTER — Other Ambulatory Visit: Payer: Self-pay | Admitting: Endocrinology

## 2012-04-23 ENCOUNTER — Encounter: Payer: Self-pay | Admitting: Gastroenterology

## 2012-05-21 ENCOUNTER — Other Ambulatory Visit (INDEPENDENT_AMBULATORY_CARE_PROVIDER_SITE_OTHER): Payer: BC Managed Care – PPO

## 2012-05-21 DIAGNOSIS — E119 Type 2 diabetes mellitus without complications: Secondary | ICD-10-CM

## 2012-05-21 DIAGNOSIS — Z0000 Encounter for general adult medical examination without abnormal findings: Secondary | ICD-10-CM

## 2012-05-21 LAB — CBC WITH DIFFERENTIAL/PLATELET
Basophils Relative: 0.6 % (ref 0.0–3.0)
Eosinophils Relative: 1.3 % (ref 0.0–5.0)
Lymphocytes Relative: 30.8 % (ref 12.0–46.0)
MCV: 82.8 fl (ref 78.0–100.0)
Monocytes Absolute: 0.5 10*3/uL (ref 0.1–1.0)
Monocytes Relative: 8.8 % (ref 3.0–12.0)
Neutrophils Relative %: 58.5 % (ref 43.0–77.0)
Platelets: 207 10*3/uL (ref 150.0–400.0)
RBC: 4.97 Mil/uL (ref 3.87–5.11)
WBC: 5.3 10*3/uL (ref 4.5–10.5)

## 2012-05-21 LAB — BASIC METABOLIC PANEL
BUN: 13 mg/dL (ref 6–23)
CO2: 29 mEq/L (ref 19–32)
Calcium: 9.7 mg/dL (ref 8.4–10.5)
Glucose, Bld: 147 mg/dL — ABNORMAL HIGH (ref 70–99)
Sodium: 141 mEq/L (ref 135–145)

## 2012-05-21 LAB — URINALYSIS, ROUTINE W REFLEX MICROSCOPIC
Nitrite: NEGATIVE
Specific Gravity, Urine: 1.02 (ref 1.000–1.030)
Urine Glucose: NEGATIVE
Urobilinogen, UA: 0.2 (ref 0.0–1.0)

## 2012-05-21 LAB — HEPATIC FUNCTION PANEL
ALT: 21 U/L (ref 0–35)
AST: 21 U/L (ref 0–37)
Bilirubin, Direct: 0.1 mg/dL (ref 0.0–0.3)
Total Bilirubin: 0.7 mg/dL (ref 0.3–1.2)

## 2012-05-21 LAB — LIPID PANEL
HDL: 49.9 mg/dL (ref >39.00)
LDL Cholesterol: 90 mg/dL (ref 0–99)
Total CHOL/HDL Ratio: 3
VLDL: 13.4 mg/dL (ref 0.0–40.0)

## 2012-05-21 LAB — HEMOGLOBIN A1C: Hgb A1c MFr Bld: 6.6 % — ABNORMAL HIGH (ref 4.6–6.5)

## 2012-05-21 LAB — TSH: TSH: 0.72 u[IU]/mL (ref 0.35–5.50)

## 2012-05-28 ENCOUNTER — Encounter: Payer: Self-pay | Admitting: Endocrinology

## 2012-05-28 ENCOUNTER — Ambulatory Visit (INDEPENDENT_AMBULATORY_CARE_PROVIDER_SITE_OTHER): Payer: BC Managed Care – PPO | Admitting: Endocrinology

## 2012-05-28 VITALS — BP 126/74 | HR 78 | Temp 98.0°F | Wt 157.0 lb

## 2012-05-28 DIAGNOSIS — Z0000 Encounter for general adult medical examination without abnormal findings: Secondary | ICD-10-CM

## 2012-05-28 DIAGNOSIS — E119 Type 2 diabetes mellitus without complications: Secondary | ICD-10-CM

## 2012-05-28 DIAGNOSIS — M25559 Pain in unspecified hip: Secondary | ICD-10-CM

## 2012-05-28 NOTE — Patient Instructions (Addendum)
Refer to an orthopedic specialist.  you will receive a phone call, about a day and time for an appointment.   please consider these measures for your health:  minimize alcohol.  do not use tobacco products.  have a colonoscopy at least every 10 years from age 69.  Women should have an annual mammogram from age 66.  keep firearms safely stored.  always use seat belts.  have working smoke alarms in your home.  see an eye doctor and dentist regularly.  never drive under the influence of alcohol or drugs (including prescription drugs).   please let me know what your wishes would be, if artificial life support measures should become necessary.  it is critically important to prevent falling down (keep floor areas well-lit, dry, and free of loose objects.  If you have a cane, walker, or wheelchair, you should use it, even for short trips around the house.  Also, try not to rush).

## 2012-05-28 NOTE — Progress Notes (Signed)
Subjective:    Patient ID: Lindsey Jenkins, female    DOB: January 29, 1941, 71 y.o.   MRN: [id number]  HPI here for regular wellness examination.  she's feeling pretty well in general, and says chronic med probs are stable, except as noted below Past Medical History  Diagnosis Date  . 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 DIABETES MELLITUS, TYPE II 04/19/2007  . HYPERLIPIDEMIA 04/19/2007  . HYPERTENSION 04/19/2007  . GASTROESOPHAGEAL REFLUX DISEASE 08/21/2007  . DIVERTICULOSIS, COLON 07/20/2006  . MENOPAUSAL SYNDROME 04/19/2007  . OSTEOPOROSIS 04/19/2007  . BACK PAIN, LUMBAR 05/21/2009  . HIP PAIN 05/24/2010    Past Surgical History  Procedure Date  . Breast biopsy 1990's    benign    History   Social History  . Marital Status: Married    Spouse Name: N/A    Number of Children: N/A  . 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 Smoking status: Never Smoker   . Smokeless tobacco: Never Used  . Alcohol Use: Not on file  . Drug Use: Not on file  . Sexually Active: Not on file   Other Topics Concern  . Not on file   Social History Narrative  . No narrative on file    Current Outpatient Prescriptions on File Prior to Visit  Medication Sig Dispense Refill  . aspirin 325 MG tablet Take 325 mg by mouth daily.        . bromocriptine (PARLODEL) 2.5 MG tablet Take 1 tablet (2.5 mg total) by mouth daily.  30 tablet  11  . colestipol (COLESTID) 1 G tablet Take 3 g by mouth daily.        Marland Kitchen glucose blood (ACCU-CHEK COMPACT TEST DRUM) test strip Two times a day and lancets dx 250.00       . JANUVIA 100 MG tablet TAKE 1 TABLET BY MOUTH ONCE DAILY  30 tablet  7  . lisinopril (PRINIVIL,ZESTRIL) 40 MG tablet TAKE 1 TABLET BY MOUTH ONCE DAILY  30 tablet  5  . metFORMIN (GLUCOPHAGE) 500 MG tablet take 2 tablets by mouth twice a day  120 tablet  5  . NIFEDICAL XL 30 MG 24 hr tablet take 1 tablet by mouth once daily   30 tablet  11  . simvastatin (ZOCOR) 40 MG tablet take 2 tablets by mouth at bedtime  60 tablet  5  . sitaGLIPtin (JANUVIA) 100 MG tablet Take 1 tablet (100 mg total) by mouth daily.  30 tablet  11  . traMADol (ULTRAM) 50 MG tablet Take 1 tablet (50 mg total) by mouth every 4 (four) hours as needed. For pain  50 tablet  5    No Known Allergies  Family History  Problem Relation Age of Onset  . Cancer Neg Hx     There were no vitals taken for this visit.     Review of Systems  Constitutional: Negative for fever and unexpected weight change.  HENT: Negative for hearing loss.   Eyes: Negative for visual disturbance.  Respiratory: Negative for shortness of breath.   Cardiovascular: Negative for chest pain.  Gastrointestinal: Negative for anal bleeding.  Genitourinary: Negative for hematuria.  Musculoskeletal: Negative for gait problem.  Skin: Negative for rash.  Neurological: Negative for syncope and headaches.  Hematological: Does not bruise/bleed easily.  Psychiatric/Behavioral: Negative for dysphoric mood.       Objective:   Physical Exam VS: see  vs page GEN: no distress HEAD: head: no deformity eyes: no periorbital swelling, no proptosis external nose and ears are normal mouth: no lesion seen NECK: supple, thyroid is not enlarged CHEST WALL: no deformity LUNGS:  Clear to auscultation BREASTS: sees gyn CV: reg rate and rhythm, no murmur ABD: abdomen is soft, nontender.  no hepatosplenomegaly.  not distended.  no hernia GENITALIA/RECTAL: sees gyn MUSCULOSKELETAL: muscle bulk and strength are grossly normal.  no obvious joint swelling.   EXTEMITIES: no deformity.  no ulcer on the feet.  feet are of normal color and temp.  no edema PULSES: dorsalis pedis intact bilat.  no carotid bruit NEURO:  cn 2-12 grossly intact.   readily moves all 4's.  sensation is intact to touch on the feet SKIN:  Normal texture and temperature.  No rash or suspicious lesion is visible.     NODES:  None palpable at the neck PSYCH: alert, oriented x3.  Does not appear anxious nor depressed.     Assessment & Plan:  Wellness visit today, with problems stable, except as noted.   SEPARATE EVALUATION FOLLOWS--EACH PROBLEM HERE IS NEW, NOT RESPONDING TO TREATMENT, OR POSES SIGNIFICANT RISK TO THE PATIENT'S HEALTH: HISTORY OF THE PRESENT ILLNESS: Pt states 1 year of moderate pain at the right hip, and assoc numbness at the right lateral thigh. PAST MEDICAL HISTORY reviewed and up to date today REVIEW OF SYSTEMS: Denies falls PHYSICAL EXAMINATION: VITAL SIGNS:  See vs page GENERAL: no distress Gait: normal and steady LAB/XRAY RESULTS: i reviewed x-ray results IMPRESSION: Hip pain, persistent PLAN: See instruction page

## 2012-05-29 ENCOUNTER — Telehealth: Payer: Self-pay | Admitting: Endocrinology

## 2012-05-29 NOTE — Telephone Encounter (Signed)
Unsure of the reason for this return call. Can you please call pt and apologize?

## 2012-05-29 NOTE — Telephone Encounter (Signed)
Pt called in and stated someone called her - but she wasn't sure who. Sonya did you call her? I have looked in the chart and don't see any documentation so I am just going around and asking. Thanks Amy

## 2012-06-15 ENCOUNTER — Other Ambulatory Visit: Payer: Self-pay | Admitting: Endocrinology

## 2012-07-12 ENCOUNTER — Encounter: Payer: Self-pay | Admitting: Endocrinology

## 2012-07-20 ENCOUNTER — Other Ambulatory Visit: Payer: Self-pay | Admitting: Endocrinology

## 2012-07-27 ENCOUNTER — Telehealth: Payer: Self-pay | Admitting: Endocrinology

## 2012-07-27 NOTE — Telephone Encounter (Signed)
Pt advised all the coupons we have expired on 07/24/12

## 2012-07-27 NOTE — Telephone Encounter (Signed)
The patient called to request copay coupons for Januvia.  The patient stated she the one she had is expiring.  The coupon is the one that give the patient the medication for $5.00.  Please call the patient at [phone].

## 2012-09-02 ENCOUNTER — Other Ambulatory Visit: Payer: Self-pay | Admitting: Endocrinology

## 2012-09-27 ENCOUNTER — Other Ambulatory Visit: Payer: Self-pay | Admitting: Endocrinology

## 2012-10-09 ENCOUNTER — Other Ambulatory Visit: Payer: Self-pay | Admitting: Obstetrics & Gynecology

## 2013-01-14 ENCOUNTER — Other Ambulatory Visit: Payer: Self-pay | Admitting: Endocrinology

## 2013-01-14 ENCOUNTER — Other Ambulatory Visit: Payer: Self-pay | Admitting: *Deleted

## 2013-01-14 MED ORDER — SIMVASTATIN 40 MG PO TABS: ORAL_TABLET | ORAL | Status: DC

## 2013-05-30 ENCOUNTER — Ambulatory Visit (INDEPENDENT_AMBULATORY_CARE_PROVIDER_SITE_OTHER): Payer: BC Managed Care – PPO | Admitting: Endocrinology

## 2013-05-30 ENCOUNTER — Ambulatory Visit
Admission: RE | Admit: 2013-05-30 | Discharge: 2013-05-30 | Disposition: A | Discharge status: Home / Self Care | Payer: BC Managed Care – PPO | Source: Ambulatory Visit | Attending: Endocrinology | Admitting: Endocrinology

## 2013-05-30 ENCOUNTER — Encounter: Payer: Self-pay | Admitting: Endocrinology

## 2013-05-30 VITALS — BP 122/80 | HR 100 | Ht 65.0 in | Wt 158.0 lb

## 2013-05-30 DIAGNOSIS — I10 Essential (primary) hypertension: Secondary | ICD-10-CM

## 2013-05-30 DIAGNOSIS — M948X9 Other specified disorders of cartilage, unspecified sites: Secondary | ICD-10-CM

## 2013-05-30 DIAGNOSIS — M542 Cervicalgia: Secondary | ICD-10-CM

## 2013-05-30 DIAGNOSIS — M89319 Hypertrophy of bone, unspecified shoulder: Secondary | ICD-10-CM | POA: Insufficient documentation

## 2013-05-30 DIAGNOSIS — Z79899 Other long term (current) drug therapy: Secondary | ICD-10-CM

## 2013-05-30 DIAGNOSIS — E785 Hyperlipidemia, unspecified: Secondary | ICD-10-CM

## 2013-05-30 DIAGNOSIS — Z0000 Encounter for general adult medical examination without abnormal findings: Secondary | ICD-10-CM

## 2013-05-30 DIAGNOSIS — E119 Type 2 diabetes mellitus without complications: Secondary | ICD-10-CM

## 2013-05-30 DIAGNOSIS — E041 Nontoxic single thyroid nodule: Secondary | ICD-10-CM

## 2013-05-30 LAB — LIPID PANEL
HDL: 61.6 mg/dL (ref >39.00)
LDL Cholesterol: 106 mg/dL — ABNORMAL HIGH (ref 0–99)
Total CHOL/HDL Ratio: 3
Triglycerides: 75 mg/dL (ref 0.0–149.0)
VLDL: 15 mg/dL (ref 0.0–40.0)

## 2013-05-30 LAB — CBC WITH DIFFERENTIAL/PLATELET
Basophils Relative: 0.4 % (ref 0.0–3.0)
Eosinophils Relative: 0.8 % (ref 0.0–5.0)
HCT: 42.7 % (ref 36.0–46.0)
Lymphocytes Relative: 23.6 % (ref 12.0–46.0)
MCV: 80.8 fl (ref 78.0–100.0)
Monocytes Absolute: 0.4 10*3/uL (ref 0.1–1.0)
Neutrophils Relative %: 68.6 % (ref 43.0–77.0)
RBC: 5.29 Mil/uL — ABNORMAL HIGH (ref 3.87–5.11)
WBC: 5.7 10*3/uL (ref 4.5–10.5)

## 2013-05-30 LAB — URINALYSIS, ROUTINE W REFLEX MICROSCOPIC
Bilirubin Urine: NEGATIVE
Ketones, ur: NEGATIVE
Specific Gravity, Urine: 1.02 (ref 1.000–1.030)
Urine Glucose: NEGATIVE
Urobilinogen, UA: 0.2 (ref 0.0–1.0)
pH: 6 (ref 5.0–8.0)

## 2013-05-30 LAB — MICROALBUMIN / CREATININE URINE RATIO
Creatinine,U: 93.5 mg/dL
Microalb Creat Ratio: 1.2 mg/g (ref 0.0–30.0)
Microalb, Ur: 1.1 mg/dL (ref 0.0–1.9)

## 2013-05-30 LAB — BASIC METABOLIC PANEL
Calcium: 9.9 mg/dL (ref 8.4–10.5)
Creatinine, Ser: 1 mg/dL (ref 0.4–1.2)
GFR: 74.77 mL/min (ref >60.00)

## 2013-05-30 LAB — HEPATIC FUNCTION PANEL
ALT: 15 U/L (ref 0–35)
Total Bilirubin: 0.7 mg/dL (ref 0.3–1.2)
Total Protein: 7.3 g/dL (ref 6.0–8.3)

## 2013-05-30 LAB — HEMOGLOBIN A1C: Hgb A1c MFr Bld: 7 % — ABNORMAL HIGH (ref 4.6–6.5)

## 2013-05-30 LAB — TSH: TSH: 0.98 u[IU]/mL (ref 0.35–5.50)

## 2013-05-30 MED ORDER — SITAGLIPTIN PHOSPHATE 100 MG PO TABS: 100.0000 mg | ORAL_TABLET | Freq: Every day | ORAL | Status: DC

## 2013-05-30 MED ORDER — NIFEDIPINE ER OSMOTIC RELEASE 30 MG PO TB24: 30.0000 mg | ORAL_TABLET | Freq: Every day | ORAL | Status: DC

## 2013-05-30 MED ORDER — SIMVASTATIN 40 MG PO TABS: ORAL_TABLET | ORAL | Status: DC

## 2013-05-30 MED ORDER — METFORMIN HCL 500 MG PO TABS: 1000.0000 mg | ORAL_TABLET | Freq: Two times a day (BID) | ORAL | Status: DC

## 2013-05-30 MED ORDER — COLESTIPOL HCL 1 G PO TABS: 3.0000 g | ORAL_TABLET | Freq: Every day | ORAL | Status: DC

## 2013-05-30 MED ORDER — GLUCOSE BLOOD VI STRP: ORAL_STRIP | Status: DC

## 2013-05-30 MED ORDER — LISINOPRIL 40 MG PO TABS: 40.0000 mg | ORAL_TABLET | Freq: Every day | ORAL | Status: DC

## 2013-05-30 MED ORDER — TRAMADOL HCL 50 MG PO TABS: 50.0000 mg | ORAL_TABLET | ORAL | Status: DC | PRN

## 2013-05-30 NOTE — Patient Instructions (Addendum)
blood tests, and x-rays, are being requested for you today.  We'll contact you with results. Also, let's check a thyroid ultrasound.  you will receive a phone call, about a day and time for an appointment. please consider these measures for your health:  minimize alcohol.  do not use tobacco products.  have a colonoscopy at least every 10 years from age 70.  Women should have an annual mammogram from age 42.  keep firearms safely stored.  always use seat belts.  have working smoke alarms in your home.  see an eye doctor and dentist regularly.  never drive under the influence of alcohol or drugs (including prescription drugs).   Please come back for a follow-up appointment in 6 months.

## 2013-05-30 NOTE — Progress Notes (Signed)
Subjective:    Patient ID: Lindsey Jenkins, female    DOB: 09-23-1940, 72 y.o.   MRN: [id number]  HPI Pt is here for regular wellness examination, and is feeling pretty well in general, and says chronic med probs are stable, except as noted below Past Medical History  Diagnosis Date  . 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 DIABETES MELLITUS, TYPE II 04/19/2007  . HYPERLIPIDEMIA 04/19/2007  . HYPERTENSION 04/19/2007  . GASTROESOPHAGEAL REFLUX DISEASE 08/21/2007  . DIVERTICULOSIS, COLON 07/20/2006  . MENOPAUSAL SYNDROME 04/19/2007  . OSTEOPOROSIS 04/19/2007  . BACK PAIN, LUMBAR 05/21/2009  . HIP PAIN 05/24/2010    Past Surgical History  Procedure Laterality Date  . Breast biopsy  1990's    benign    History   Social History  . Marital Status: Married    Spouse Name: N/A    Number of Children: N/A  . 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 Smoking status: Never Smoker   . Smokeless tobacco: Never Used  . Alcohol Use: Not on file  . Drug Use: Not on file  . Sexual Activity: Not on file   Other Topics Concern  . Not on file   Social History Narrative  . No narrative on file    Current Outpatient Prescriptions on File Prior to Visit  Medication Sig Dispense Refill  . aspirin 325 MG tablet Take 325 mg by mouth daily.        . bromocriptine (PARLODEL) 2.5 MG tablet Take 1 tablet (2.5 mg total) by mouth daily.  30 tablet  11   No current facility-administered medications on file prior to visit.    No Known Allergies  Family History  Problem Relation Age of Onset  . Cancer Neg Hx     BP 122/80  Pulse 100  Ht 5\' 5"  (1.651 m)  Wt 158 lb (71.668 kg)  BMI 26.29 kg/m2  SpO2 98%  Review of Systems  Constitutional: Negative for fever.  HENT: Negative for hearing loss.   Eyes: Negative for visual disturbance.  Respiratory: Negative for shortness of breath.   Cardiovascular: Negative  for chest pain.  Gastrointestinal: Negative for anal bleeding.  Endocrine: Negative for cold intolerance.  Genitourinary: Negative for hematuria.  Musculoskeletal: Negative for gait problem.  Skin: Negative for rash.  Allergic/Immunologic: Negative for environmental allergies.  Neurological: Negative for syncope.  Hematological: Does not bruise/bleed easily.  Psychiatric/Behavioral: Negative for dysphoric mood.       Objective:   Physical Exam VS: see vs page GEN: no distress HEAD: head: no deformity eyes: no periorbital swelling, no proptosis external nose and ears are normal mouth: no lesion seen NECK: full ROM, without pain. LUNGS:  Clear to auscultation BREASTS:  sees gyn CV: reg rate and rhythm, no murmur ABD: abdomen is soft, nontender.  no hepatosplenomegaly.  not distended.  no hernia GENITALIA/RECTAL: sees gyn MUSCULOSKELETAL: muscle bulk and strength are grossly normal.  no obvious joint swelling.  gait is normal and steady PULSES: no carotid bruit NEURO:  cn 2-12 grossly intact.   readily moves all 4's.  SKIN:  Normal texture and temperature.  No rash or suspicious lesion is visible.   NODES:  None palpable at the neck PSYCH: alert, oriented x3.  Does not appear anxious nor depressed.    i reviewed electrocardiogram    Assessment & Plan:  Wellness visit today, with problems stable, except as  noted. we discussed code status.  pt requests full code, but would not want to be started or maintained on artificial life-support measures if there was not a reasonable chance of recovery    SEPARATE EVALUATION FOLLOWS--EACH PROBLEM HERE IS NEW, NOT RESPONDING TO TREATMENT, OR POSES SIGNIFICANT RISK TO THE PATIENT'S HEALTH: HISTORY OF THE PRESENT ILLNESS: Pt states 2 mos of moderate pain at the right posterior neck, but no assoc numbness.   PAST MEDICAL HISTORY reviewed and up to date today REVIEW OF SYSTEMS: Pt reports prominence of the right clavicle.  Denies weight  change PHYSICAL EXAMINATION: VITAL SIGNS:  See vs page GENERAL: no distress 1-2 cm right thyroid nodule. Chest wall: right clavicular head is prominent LAB/XRAY RESULTS: (i reviewed today's x-ray results) IMPRESSION: Thyroid nodule, new Neck pain, prob due to OA Prominent clavicular head, seldom clinically significant PLAN: See instruction page

## 2013-06-12 ENCOUNTER — Other Ambulatory Visit: Payer: BC Managed Care – PPO

## 2013-06-13 ENCOUNTER — Ambulatory Visit
Admission: RE | Admit: 2013-06-13 | Discharge: 2013-06-13 | Disposition: A | Discharge status: Home / Self Care | Payer: BC Managed Care – PPO | Source: Ambulatory Visit | Attending: Endocrinology | Admitting: Endocrinology

## 2013-07-16 ENCOUNTER — Other Ambulatory Visit: Payer: Self-pay | Admitting: Endocrinology

## 2013-07-17 NOTE — Telephone Encounter (Signed)
Please advise if ok to refill during Dr. Ellison's absence. Thanks!  

## 2013-07-17 NOTE — Telephone Encounter (Signed)
Ok, no refills 

## 2013-07-22 NOTE — Telephone Encounter (Signed)
Script faxed to pharmacy

## 2013-12-04 ENCOUNTER — Ambulatory Visit (INDEPENDENT_AMBULATORY_CARE_PROVIDER_SITE_OTHER): Payer: BC Managed Care – PPO | Admitting: Endocrinology

## 2013-12-04 ENCOUNTER — Encounter: Payer: Self-pay | Admitting: Endocrinology

## 2013-12-04 VITALS — BP 122/80 | HR 92 | Temp 97.9°F | Ht 65.0 in | Wt 157.0 lb

## 2013-12-04 DIAGNOSIS — E119 Type 2 diabetes mellitus without complications: Secondary | ICD-10-CM

## 2013-12-04 LAB — HEMOGLOBIN A1C
HEMOGLOBIN A1C: 7.2 % — AB (ref <5.7)
MEAN PLASMA GLUCOSE: 160 mg/dL — AB (ref <117)

## 2013-12-04 MED ORDER — REPAGLINIDE 1 MG PO TABS: 1.0000 mg | ORAL_TABLET | Freq: Three times a day (TID) | ORAL | Status: DC

## 2013-12-04 NOTE — Patient Instructions (Addendum)
blood tests are being requested for you today.  We'll contact you with results. Please change januvia to "repaglinide."  i have sent a prescription to your pharmacy. Please come back for a follow-up appointment in 6 months (after 05/30/14).  check your blood sugar once a day.  vary the time of day when you check, between before the 3 meals, and at bedtime.  also check if you have symptoms of your blood sugar being too high or too low.  please keep a record of the readings and bring it to your next appointment here.  You can write it on any piece of paper.  please call us sooner if your blood sugar goes below 70, or if you have a lot of readings over 200.

## 2013-12-04 NOTE — Progress Notes (Signed)
Subjective:    Patient ID: Lindsey Jenkins, female    DOB: 1940/10/29, 73 y.o.   MRN: [id number]  HPI Pt returns for f/u of type 2 DM (dx'ed 2002; she has mild if any neuropathy of the lower extremities; no known chronic complications; she has never been on insulin; she takes 3 oral meds; no h/o pancreatits).  no cbg record, but states cbg's are well-controlled.  pt states she feels well in general.  She says Celesta Gentile is too expensive.   Past Medical History  Diagnosis Date  . 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 DIABETES MELLITUS, TYPE II 04/19/2007  . HYPERLIPIDEMIA 04/19/2007  . HYPERTENSION 04/19/2007  . GASTROESOPHAGEAL REFLUX DISEASE 08/21/2007  . DIVERTICULOSIS, COLON 07/20/2006  . MENOPAUSAL SYNDROME 04/19/2007  . OSTEOPOROSIS 04/19/2007  . BACK PAIN, LUMBAR 05/21/2009  . HIP PAIN 05/24/2010    Past Surgical History  Procedure Laterality Date  . Breast biopsy  1990's    benign    History   Social History  . Marital Status: Married    Spouse Name: N/A    Number of Children: N/A  . 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 Smoking status: Never Smoker   . Smokeless tobacco: Never Used  . Alcohol Use: Not on file  . Drug Use: Not on file  . Sexual Activity: Not on file   Other Topics Concern  . Not on file   Social History Narrative  . No narrative on file    Current Outpatient Prescriptions on File Prior to Visit  Medication Sig Dispense Refill  . aspirin 325 MG tablet Take 325 mg by mouth daily.        . colestipol (COLESTID) 1 G tablet Take 3 tablets (3 g total) by mouth daily.  90 tablet  11  . glucose blood (ACCU-CHEK COMPACT TEST DRUM) test strip Two times a day and lancets dx 250.00  100 each  11  . lisinopril (PRINIVIL,ZESTRIL) 40 MG tablet Take 1 tablet (40 mg total) by mouth daily.  60 tablet  5  . metFORMIN (GLUCOPHAGE) 500 MG tablet Take 2 tablets (1,000 mg total) by  mouth 2 (two) times daily with a meal.  180 tablet  11  . NIFEdipine (NIFEDICAL XL) 30 MG 24 hr tablet Take 1 tablet (30 mg total) by mouth daily.  30 tablet  11  . simvastatin (ZOCOR) 40 MG tablet TAKE 2 TABLETS BY MOUTH AT BEDTIME  60 tablet  11  . traMADol (ULTRAM) 50 MG tablet Take 1 tablet (50 mg total) by mouth every 4 (four) hours as needed. For pain  50 tablet  5  . bromocriptine (PARLODEL) 2.5 MG tablet Take 1 tablet (2.5 mg total) by mouth daily.  30 tablet  11   No current facility-administered medications on file prior to visit.    No Known Allergies  Family History  Problem Relation Age of Onset  . Cancer Neg Hx     BP 122/80  Pulse 92  Temp(Src) 97.9 F (36.6 C) (Oral)  Ht 5\' 5"  (1.651 m)  Wt 157 lb (71.215 kg)  BMI 26.13 kg/m2  SpO2 97%  Review of Systems She denies hypoglycemia and weight change.     Objective:   Physical Exam VITAL SIGNS:  See vs page GENERAL: no distress   Lab Results  Component Value Date   HGBA1C 7.2* 12/04/2013  Assessment & Plan:  Type 2 DM: Needs increased rx, if it can be done with a regimen that avoids or minimizes hypoglycemia. Onychomycosis: pt declines rx. Economic circumstances: these complicate the rx of DM

## 2014-05-21 ENCOUNTER — Telehealth: Payer: Self-pay

## 2014-05-21 NOTE — Telephone Encounter (Signed)
Received a refill request for Januvia. Medication is not on current med list.  Please advise if ok to refill. Thanks!

## 2014-05-21 NOTE — Telephone Encounter (Signed)
Lvom advising pt of below and message and to please call back and schedule follow up appointment. Pharmacy notified that med has been d/c.

## 2014-05-21 NOTE — Telephone Encounter (Signed)
It was changed to a generic DM med. Ov is due

## 2014-06-04 ENCOUNTER — Other Ambulatory Visit (INDEPENDENT_AMBULATORY_CARE_PROVIDER_SITE_OTHER): Payer: BC Managed Care – PPO

## 2014-06-04 ENCOUNTER — Other Ambulatory Visit: Payer: Self-pay

## 2014-06-04 DIAGNOSIS — Z0000 Encounter for general adult medical examination without abnormal findings: Secondary | ICD-10-CM

## 2014-06-04 LAB — CBC WITH DIFFERENTIAL/PLATELET
BASOS ABS: 0 10*3/uL (ref 0.0–0.1)
Basophils Relative: 0.3 % (ref 0.0–3.0)
EOS ABS: 0.1 10*3/uL (ref 0.0–0.7)
Eosinophils Relative: 0.9 % (ref 0.0–5.0)
HEMATOCRIT: 43.6 % (ref 36.0–46.0)
Hemoglobin: 14.1 g/dL (ref 12.0–15.0)
LYMPHS ABS: 1.6 10*3/uL (ref 0.7–4.0)
Lymphocytes Relative: 28.9 % (ref 12.0–46.0)
MCHC: 32.3 g/dL (ref 30.0–36.0)
MCV: 80.4 fl (ref 78.0–100.0)
Monocytes Absolute: 0.5 10*3/uL (ref 0.1–1.0)
Monocytes Relative: 8.5 % (ref 3.0–12.0)
Neutro Abs: 3.4 10*3/uL (ref 1.4–7.7)
Neutrophils Relative %: 61.4 % (ref 43.0–77.0)
PLATELETS: 240 10*3/uL (ref 150.0–400.0)
RBC: 5.42 Mil/uL — ABNORMAL HIGH (ref 3.87–5.11)
RDW: 15.1 % (ref 11.5–15.5)
WBC: 5.5 10*3/uL (ref 4.0–10.5)

## 2014-06-04 LAB — URINALYSIS, ROUTINE W REFLEX MICROSCOPIC
Bilirubin Urine: NEGATIVE
HGB URINE DIPSTICK: NEGATIVE
Ketones, ur: NEGATIVE
Nitrite: NEGATIVE
RBC / HPF: NONE SEEN (ref 0–?)
Specific Gravity, Urine: 1.01 (ref 1.000–1.030)
TOTAL PROTEIN, URINE-UPE24: NEGATIVE
UROBILINOGEN UA: 0.2 (ref 0.0–1.0)
Urine Glucose: NEGATIVE
pH: 6.5 (ref 5.0–8.0)

## 2014-06-04 LAB — LIPID PANEL
CHOLESTEROL: 187 mg/dL (ref 0–200)
HDL: 51.5 mg/dL (ref >39.00)
LDL CALC: 115 mg/dL — AB (ref 0–99)
NonHDL: 135.5
Total CHOL/HDL Ratio: 4
Triglycerides: 105 mg/dL (ref 0.0–149.0)
VLDL: 21 mg/dL (ref 0.0–40.0)

## 2014-06-04 LAB — BASIC METABOLIC PANEL
BUN: 13 mg/dL (ref 6–23)
CALCIUM: 10 mg/dL (ref 8.4–10.5)
CO2: 28 meq/L (ref 19–32)
Chloride: 107 mEq/L (ref 96–112)
Creatinine, Ser: 0.9 mg/dL (ref 0.4–1.2)
GFR: 78.34 mL/min (ref >60.00)
GLUCOSE: 185 mg/dL — AB (ref 70–99)
Potassium: 4.5 mEq/L (ref 3.5–5.1)
SODIUM: 142 meq/L (ref 135–145)

## 2014-06-04 LAB — MICROALBUMIN / CREATININE URINE RATIO
Creatinine,U: 96.9 mg/dL
MICROALB/CREAT RATIO: 0.6 mg/g (ref 0.0–30.0)
Microalb, Ur: 0.6 mg/dL (ref 0.0–1.9)

## 2014-06-04 LAB — HEMOGLOBIN A1C: Hgb A1c MFr Bld: 7.2 % — ABNORMAL HIGH (ref 4.6–6.5)

## 2014-06-04 LAB — TSH: TSH: 0.8 u[IU]/mL (ref 0.35–4.50)

## 2014-06-05 LAB — HEPATIC FUNCTION PANEL (6)
ALT: 12 IU/L (ref 0–32)
AST: 15 IU/L (ref 0–40)
Albumin: 4 g/dL (ref 3.5–4.8)
Alkaline Phosphatase: 78 IU/L (ref 39–117)
Bilirubin, Direct: 0.17 mg/dL (ref 0.00–0.40)
Total Bilirubin: 0.6 mg/dL (ref 0.0–1.2)

## 2014-06-17 ENCOUNTER — Other Ambulatory Visit: Payer: Self-pay

## 2014-06-17 ENCOUNTER — Ambulatory Visit (INDEPENDENT_AMBULATORY_CARE_PROVIDER_SITE_OTHER): Payer: BC Managed Care – PPO | Admitting: Endocrinology

## 2014-06-17 ENCOUNTER — Encounter: Payer: Self-pay | Admitting: Endocrinology

## 2014-06-17 VITALS — BP 122/72 | HR 104 | Temp 98.4°F | Ht 65.0 in | Wt 157.0 lb

## 2014-06-17 DIAGNOSIS — Z0000 Encounter for general adult medical examination without abnormal findings: Secondary | ICD-10-CM

## 2014-06-17 DIAGNOSIS — M810 Age-related osteoporosis without current pathological fracture: Secondary | ICD-10-CM

## 2014-06-17 DIAGNOSIS — Z23 Encounter for immunization: Secondary | ICD-10-CM

## 2014-06-17 MED ORDER — NIFEDIPINE ER OSMOTIC RELEASE 30 MG PO TB24: 30.0000 mg | ORAL_TABLET | Freq: Every day | ORAL | Status: DC

## 2014-06-17 MED ORDER — GLUCOSE BLOOD VI STRP: ORAL_STRIP | Status: DC

## 2014-06-17 MED ORDER — LISINOPRIL 40 MG PO TABS: 40.0000 mg | ORAL_TABLET | Freq: Every day | ORAL | Status: DC

## 2014-06-17 MED ORDER — METFORMIN HCL 500 MG PO TABS: 1000.0000 mg | ORAL_TABLET | Freq: Two times a day (BID) | ORAL | Status: DC

## 2014-06-17 MED ORDER — SIMVASTATIN 40 MG PO TABS: ORAL_TABLET | ORAL | Status: DC

## 2014-06-17 NOTE — Progress Notes (Signed)
Subjective:    Patient ID: Lindsey Jenkins, female    DOB: 1940-10-29, 73 y.o.   MRN: [id number]  HPI Wellness visit today, with problems stable, except as noted. Past Medical History  Diagnosis Date  . 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 DIABETES MELLITUS, TYPE II 04/19/2007  . HYPERLIPIDEMIA 04/19/2007  . HYPERTENSION 04/19/2007  . GASTROESOPHAGEAL REFLUX DISEASE 08/21/2007  . DIVERTICULOSIS, COLON 07/20/2006  . MENOPAUSAL SYNDROME 04/19/2007  . OSTEOPOROSIS 04/19/2007  . BACK PAIN, LUMBAR 05/21/2009  . HIP PAIN 05/24/2010    Past Surgical History  Procedure Laterality Date  . Breast biopsy  1990's    benign    History   Social History  . Marital Status: Married    Spouse Name: N/A    Number of Children: N/A  . 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 Smoking status: Never Smoker   . Smokeless tobacco: Never Used  . Alcohol Use: Not on file  . Drug Use: Not on file  . Sexual Activity: Not on file   Other Topics Concern  . Not on file   Social History Narrative    Current Outpatient Prescriptions on File Prior to Visit  Medication Sig Dispense Refill  . aspirin 325 MG tablet Take 325 mg by mouth daily.      . colestipol (COLESTID) 1 G tablet Take 3 tablets (3 g total) by mouth daily. 90 tablet 11  . traMADol (ULTRAM) 50 MG tablet Take 1 tablet (50 mg total) by mouth every 4 (four) hours as needed. For pain 50 tablet 5   No current facility-administered medications on file prior to visit.    No Known Allergies  Family History  Problem Relation Age of Onset  . Cancer Neg Hx     BP 122/72 mmHg  Pulse 104  Temp(Src) 98.4 F (36.9 C) (Oral)  Ht 5\' 5"  (1.651 m)  Wt 157 lb (71.215 kg)  BMI 26.13 kg/m2  SpO2 97%  Review of Systems  Constitutional: Negative for fever.  HENT: Negative for hearing loss.   Eyes: Negative for visual disturbance.  Respiratory: Negative for  cough.   Cardiovascular: Negative for chest pain.  Gastrointestinal: Negative for anal bleeding.  Genitourinary: Negative for hematuria.  Musculoskeletal: Negative for gait problem.  Skin: Negative for rash.  Allergic/Immunologic: Negative for environmental allergies.  Neurological: Negative for numbness.  Hematological: Does not bruise/bleed easily.  Psychiatric/Behavioral: Negative for dysphoric mood.       Objective:   Physical Exam VS: see vs page GEN: no distress HEAD: head: no deformity eyes: no periorbital swelling, no proptosis external nose and ears are normal mouth: no lesion seen NECK: supple, thyroid is not enlarged, but the 1-2 cm right thyroid nodule is again noted.   CHEST WALL: no deformity LUNGS:  Clear to auscultation BREASTS: sees gyn CV: reg rate and rhythm, no murmur ABD: abdomen is soft, nontender.  no hepatosplenomegaly.  not distended.  Self-reducing ventral hernia GENITALIA/RECTAL: sees gyn MUSCULOSKELETAL: muscle bulk and strength are grossly normal.  no obvious joint swelling.  gait is normal and steady. PULSES:  no carotid bruit NEURO:  cn 2-12 grossly intact.   readily moves all 4's.  SKIN:  Normal texture and temperature.  No rash or suspicious lesion is visible.   NODES:  None palpable at the neck PSYCH: alert, well-oriented.  Does not appear anxious nor depressed.  Assessment & Plan:  Wellness visit today, with problems stable, except as noted. please consider these measures for your health:  minimize alcohol.  do not use tobacco products.  have a colonoscopy at least every 10 years from age 34.  Women should have an annual mammogram from age 48.  keep firearms safely stored.  always use seat belts.  have working smoke alarms in your home.  see an eye doctor and dentist regularly.  never drive under the influence of alcohol or drugs (including prescription drugs).  those with fair skin should take precautions against the sun. it is  critically important to prevent falling down (keep floor areas well-lit, dry, and free of loose objects.  If you have a cane, walker, or wheelchair, you should use it, even for short trips around the house.  Also, try not to rush)   SEPARATE EVALUATION FOLLOWS--EACH PROBLEM HERE IS NEW, NOT RESPONDING TO TREATMENT, OR POSES SIGNIFICANT RISK TO THE PATIENT'S HEALTH: HISTORY OF THE PRESENT ILLNESS: Pt returns for f/u of diabetes mellitus: DM type: 2 Dx'ed: 6283 Complications: none Therapy: 3 oral meds GDM: never DKA: never Severe hypoglycemia: never Pancreatitis: never Other: she has never been on insulin Interval history:  Pt states prandin caused slight palpitations in the chest, but no assoc sob.  PAST MEDICAL HISTORY reviewed and up to date today REVIEW OF SYSTEMS: Pt says parlodel "made me feel different," so she stopped it also.  Denies weight change. PHYSICAL EXAMINATION: VITAL SIGNS:  See vs page GENERAL: no distress Pulses: dorsalis pedis intact bilat.   Feet: no deformity.  Trace bilat leg edema.  There is bilateral onychomycosis. Skin:  no ulcer on the feet.  normal color and temp. Neuro: sensation is intact to touch on the feet LAB/XRAY RESULTS: Lab Results  Component Value Date   HGBA1C 7.2* 06/04/2014   Lab Results  Component Value Date   CHOL 187 06/04/2014   HDL 51.50 06/04/2014   LDLCALC 115* 06/04/2014   LDLDIRECT 84.4 05/16/2011   TRIG 105.0 06/04/2014   CHOLHDL 4 06/04/2014  i reviewed electrocardiogram. IMPRESSION: DM: mild exacerbation Dyslipidemia: mild exacerbation Tachycardia, mild PLAN: i advised pt to add "invokana."  She declines for now.   Pt also declines to change zocor to a more effective med. We'll follow the heart rate for now.

## 2014-06-17 NOTE — Progress Notes (Signed)
we discussed code status.  pt requests full code, but would not want to be started or maintained on artificial life-support measures if there was not a reasonable chance of recovery 

## 2014-06-17 NOTE — Patient Instructions (Addendum)
please consider these measures for your health:  minimize alcohol.  do not use tobacco products.  have a colonoscopy at least every 10 years from age 71.  Women should have an annual mammogram from age 38.  keep firearms safely stored.  always use seat belts.  have working smoke alarms in your home.  see an eye doctor and dentist regularly.  never drive under the influence of alcohol or drugs (including prescription drugs).   it is critically important to prevent falling down (keep floor areas well-lit, dry, and free of loose objects.  If you have a cane, walker, or wheelchair, you should use it, even for short trips around the house.  Also, try not to rush). Please come back for a follow-up appointment in 3 months.

## 2014-07-08 ENCOUNTER — Ambulatory Visit (INDEPENDENT_AMBULATORY_CARE_PROVIDER_SITE_OTHER)
Admission: RE | Admit: 2014-07-08 | Discharge: 2014-07-08 | Disposition: A | Discharge status: Home / Self Care | Payer: BC Managed Care – PPO | Source: Ambulatory Visit | Attending: Endocrinology | Admitting: Endocrinology

## 2014-07-08 ENCOUNTER — Other Ambulatory Visit: Payer: Self-pay

## 2014-07-08 DIAGNOSIS — M810 Age-related osteoporosis without current pathological fracture: Secondary | ICD-10-CM

## 2014-07-08 MED ORDER — SITAGLIPTIN PHOSPHATE 100 MG PO TABS: 100.0000 mg | ORAL_TABLET | Freq: Every day | ORAL | Status: DC

## 2014-07-08 NOTE — Telephone Encounter (Signed)
Refill sent for Januvia.

## 2014-07-10 ENCOUNTER — Telehealth: Payer: Self-pay

## 2014-07-10 MED ORDER — TRAMADOL HCL 50 MG PO TABS: 50.0000 mg | ORAL_TABLET | ORAL | Status: DC | PRN

## 2014-07-10 MED ORDER — COLESTIPOL HCL 1 G PO TABS: 3.0000 g | ORAL_TABLET | Freq: Every day | ORAL | Status: DC

## 2014-07-10 MED ORDER — METFORMIN HCL 500 MG PO TABS: 1000.0000 mg | ORAL_TABLET | Freq: Two times a day (BID) | ORAL | Status: DC

## 2014-07-10 MED ORDER — LISINOPRIL 40 MG PO TABS: 40.0000 mg | ORAL_TABLET | Freq: Every day | ORAL | Status: DC

## 2014-07-10 MED ORDER — SIMVASTATIN 40 MG PO TABS: ORAL_TABLET | ORAL | Status: DC

## 2014-07-10 NOTE — Telephone Encounter (Signed)
OK 

## 2014-07-10 NOTE — Telephone Encounter (Signed)
Pt called requesting a refill for Tramadol. Will you sign during Dr. Cordelia Pen absence?  Thanks!

## 2014-07-11 NOTE — Telephone Encounter (Signed)
Rx faxed to pharmacy  

## 2014-11-28 ENCOUNTER — Other Ambulatory Visit: Payer: Self-pay

## 2014-11-28 MED ORDER — SITAGLIPTIN PHOSPHATE 100 MG PO TABS: 100.0000 mg | ORAL_TABLET | Freq: Every day | ORAL | Status: DC

## 2014-12-09 ENCOUNTER — Other Ambulatory Visit: Payer: Self-pay | Admitting: Obstetrics & Gynecology

## 2014-12-10 LAB — CYTOLOGY - PAP

## 2015-02-12 ENCOUNTER — Other Ambulatory Visit: Payer: Self-pay

## 2015-02-12 MED ORDER — SITAGLIPTIN PHOSPHATE 100 MG PO TABS: 100.0000 mg | ORAL_TABLET | Freq: Every day | ORAL | Status: DC

## 2015-04-23 ENCOUNTER — Other Ambulatory Visit: Payer: Self-pay

## 2015-04-23 MED ORDER — SITAGLIPTIN PHOSPHATE 100 MG PO TABS: 100.0000 mg | ORAL_TABLET | Freq: Every day | ORAL | Status: DC

## 2015-06-10 ENCOUNTER — Encounter: Payer: Self-pay | Admitting: Gastroenterology

## 2015-06-22 ENCOUNTER — Ambulatory Visit (INDEPENDENT_AMBULATORY_CARE_PROVIDER_SITE_OTHER): Payer: Medicare Other | Admitting: Endocrinology

## 2015-06-22 ENCOUNTER — Encounter: Payer: Self-pay | Admitting: Endocrinology

## 2015-06-22 VITALS — BP 132/82 | HR 96 | Temp 98.2°F | Ht 65.0 in | Wt 159.0 lb

## 2015-06-22 DIAGNOSIS — I10 Essential (primary) hypertension: Secondary | ICD-10-CM | POA: Diagnosis not present

## 2015-06-22 DIAGNOSIS — Z23 Encounter for immunization: Secondary | ICD-10-CM

## 2015-06-22 DIAGNOSIS — M810 Age-related osteoporosis without current pathological fracture: Secondary | ICD-10-CM | POA: Diagnosis not present

## 2015-06-22 DIAGNOSIS — E785 Hyperlipidemia, unspecified: Secondary | ICD-10-CM | POA: Diagnosis not present

## 2015-06-22 DIAGNOSIS — E042 Nontoxic multinodular goiter: Secondary | ICD-10-CM | POA: Insufficient documentation

## 2015-06-22 DIAGNOSIS — R9431 Abnormal electrocardiogram [ECG] [EKG]: Secondary | ICD-10-CM

## 2015-06-22 DIAGNOSIS — E119 Type 2 diabetes mellitus without complications: Secondary | ICD-10-CM | POA: Diagnosis not present

## 2015-06-22 LAB — BASIC METABOLIC PANEL
BUN: 14 mg/dL (ref 6–23)
CHLORIDE: 107 meq/L (ref 96–112)
CO2: 28 meq/L (ref 19–32)
CREATININE: 0.81 mg/dL (ref 0.40–1.20)
Calcium: 9.6 mg/dL (ref 8.4–10.5)
GFR: 89.34 mL/min (ref >60.00)
Glucose, Bld: 170 mg/dL — ABNORMAL HIGH (ref 70–99)
POTASSIUM: 3.6 meq/L (ref 3.5–5.1)
Sodium: 142 mEq/L (ref 135–145)

## 2015-06-22 LAB — CBC WITH DIFFERENTIAL/PLATELET
BASOS PCT: 0.4 % (ref 0.0–3.0)
Basophils Absolute: 0 10*3/uL (ref 0.0–0.1)
EOS PCT: 1.1 % (ref 0.0–5.0)
Eosinophils Absolute: 0.1 10*3/uL (ref 0.0–0.7)
HEMATOCRIT: 42.3 % (ref 36.0–46.0)
Hemoglobin: 13.8 g/dL (ref 12.0–15.0)
LYMPHS PCT: 26.2 % (ref 12.0–46.0)
Lymphs Abs: 1.2 10*3/uL (ref 0.7–4.0)
MCHC: 32.6 g/dL (ref 30.0–36.0)
MCV: 80.6 fl (ref 78.0–100.0)
MONOS PCT: 7.6 % (ref 3.0–12.0)
Monocytes Absolute: 0.3 10*3/uL (ref 0.1–1.0)
NEUTROS ABS: 3 10*3/uL (ref 1.4–7.7)
Neutrophils Relative %: 64.7 % (ref 43.0–77.0)
PLATELETS: 225 10*3/uL (ref 150.0–400.0)
RBC: 5.24 Mil/uL — ABNORMAL HIGH (ref 3.87–5.11)
RDW: 14.4 % (ref 11.5–15.5)
WBC: 4.6 10*3/uL (ref 4.0–10.5)

## 2015-06-22 LAB — LIPID PANEL
CHOL/HDL RATIO: 3
CHOLESTEROL: 157 mg/dL (ref 0–200)
HDL: 50.8 mg/dL (ref >39.00)
LDL CALC: 90 mg/dL (ref 0–99)
NonHDL: 105.89
TRIGLYCERIDES: 81 mg/dL (ref 0.0–149.0)
VLDL: 16.2 mg/dL (ref 0.0–40.0)

## 2015-06-22 LAB — MICROALBUMIN / CREATININE URINE RATIO
Creatinine,U: 72.8 mg/dL
MICROALB/CREAT RATIO: 1 mg/g (ref 0.0–30.0)

## 2015-06-22 LAB — HEPATIC FUNCTION PANEL
ALT: 12 U/L (ref 0–35)
AST: 14 U/L (ref 0–37)
Albumin: 3.6 g/dL (ref 3.5–5.2)
Alkaline Phosphatase: 71 U/L (ref 39–117)
BILIRUBIN DIRECT: 0.1 mg/dL (ref 0.0–0.3)
BILIRUBIN TOTAL: 0.5 mg/dL (ref 0.2–1.2)
Total Protein: 6.7 g/dL (ref 6.0–8.3)

## 2015-06-22 LAB — URINALYSIS, ROUTINE W REFLEX MICROSCOPIC
Bilirubin Urine: NEGATIVE
Hgb urine dipstick: NEGATIVE
Ketones, ur: NEGATIVE
Leukocytes, UA: NEGATIVE
Nitrite: NEGATIVE
SPECIFIC GRAVITY, URINE: 1.02 (ref 1.000–1.030)
TOTAL PROTEIN, URINE-UPE24: NEGATIVE
URINE GLUCOSE: NEGATIVE
Urobilinogen, UA: 0.2 (ref 0.0–1.0)
pH: 6 (ref 5.0–8.0)

## 2015-06-22 LAB — TSH: TSH: 0.9 u[IU]/mL (ref 0.35–4.50)

## 2015-06-22 LAB — POCT GLYCOSYLATED HEMOGLOBIN (HGB A1C): HEMOGLOBIN A1C: 8.1

## 2015-06-22 MED ORDER — BROMOCRIPTINE MESYLATE 2.5 MG PO TABS: ORAL_TABLET | ORAL | Status: DC

## 2015-06-22 NOTE — Progress Notes (Signed)
we discussed code status.  pt requests full code, but would not want to be started or maintained on artificial life-support measures if there was not a reasonable chance of recovery 

## 2015-06-22 NOTE — Progress Notes (Signed)
Subjective:    Patient ID: Lindsey Jenkins, female    DOB: 1941-04-23, 74 y.o.   MRN: [id number]  HPI The state of at least three ongoing medical problems is addressed today, with interval history of each noted here: Pt returns for f/u of diabetes mellitus: DM type: 2 Dx'ed: 123XX123 Complications: none Therapy: 3 oral meds GDM: never DKA: never Severe hypoglycemia: never Pancreatitis: never Other: she has never been on insulin; she did not tolerate prandin (palpitations) Interval history: Denies weight change.  She requests qd meds only.  She declines pioglitizone, due to risk of edema.  dyslipidemia: she denies sob HTN: she denies cough Past Medical History  Diagnosis Date  . 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 DIABETES MELLITUS, TYPE II 04/19/2007  . HYPERLIPIDEMIA 04/19/2007  . HYPERTENSION 04/19/2007  . GASTROESOPHAGEAL REFLUX DISEASE 08/21/2007  . DIVERTICULOSIS, COLON 07/20/2006  . MENOPAUSAL SYNDROME 04/19/2007  . OSTEOPOROSIS 04/19/2007  . BACK PAIN, LUMBAR 05/21/2009  . HIP PAIN 05/24/2010    Past Surgical History  Procedure Laterality Date  . Breast biopsy  1990's    benign    Social History   Social History  . Marital Status: Married    Spouse Name: N/A  . Number of Children: N/A  . 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 Smoking status: Never Smoker   . Smokeless tobacco: Never Used  . Alcohol Use: Not on file  . Drug Use: Not on file  . Sexual Activity: Not on file   Other Topics Concern  . Not on file   Social History Narrative    Current Outpatient Prescriptions on File Prior to Visit  Medication Sig Dispense Refill  . aspirin 325 MG tablet Take 325 mg by mouth daily.      Marland Kitchen glucose blood (ACCU-CHEK COMPACT TEST DRUM) test strip Two times a day and lancets dx 250.00 100 each 11  . lisinopril (PRINIVIL,ZESTRIL) 40 MG tablet Take 1 tablet (40 mg total) by mouth  daily. 60 tablet 11  . metFORMIN (GLUCOPHAGE) 500 MG tablet Take 2 tablets (1,000 mg total) by mouth 2 (two) times daily with a meal. 180 tablet 11  . NIFEdipine (NIFEDICAL XL) 30 MG 24 hr tablet Take 1 tablet (30 mg total) by mouth daily. 30 tablet 11  . simvastatin (ZOCOR) 40 MG tablet TAKE 2 TABLETS BY MOUTH AT BEDTIME 60 tablet 11  . sitaGLIPtin (JANUVIA) 100 MG tablet Take 1 tablet (100 mg total) by mouth daily. 30 tablet 1  . traMADol (ULTRAM) 50 MG tablet Take 1 tablet (50 mg total) by mouth every 4 (four) hours as needed. For pain 50 tablet 5  . colestipol (COLESTID) 1 G tablet Take 3 tablets (3 g total) by mouth daily. (Patient not taking: Reported on 06/22/2015) 90 tablet 11   No current facility-administered medications on file prior to visit.    No Known Allergies  Family History  Problem Relation Age of Onset  . Cancer Neg Hx     BP 132/82 mmHg  Pulse 96  Temp(Src) 98.2 F (36.8 C) (Oral)  Ht 5\' 5"  (1.651 m)  Wt 159 lb (72.122 kg)  BMI 26.46 kg/m2  SpO2 95%    Review of Systems She denies hypoglycemia and chest pain    Objective:   Physical Exam VITAL SIGNS:  See vs page GENERAL: no distress NECK: There is no palpable thyroid enlargement, but  the 1-2 cm right thyroid nodule is again noted.  Nodes: no palpable lymphadenopathy at the anterior neck.  LUNGS:  Clear to auscultation HEART:  Regular rate and rhythm without murmurs noted. Normal S1,S2.   Pulses: dorsalis pedis intact bilat.   MSK: no deformity of the feet CV: no leg edema Skin:  no ulcer on the feet.  normal color and temp on the feet. Neuro: sensation is intact to touch on the feet.   A1c=8.1% Lab Results  Component Value Date   CHOL 157 06/22/2015   HDL 50.80 06/22/2015   LDLCALC 90 06/22/2015   LDLDIRECT 84.4 05/16/2011   TRIG 81.0 06/22/2015   CHOLHDL 3 06/22/2015      Assessment & Plan:  DM: he needs increased rx: i have sent a prescription to your pharmacy, to add "bromocriptine,"  to help your blood sugar.  HTN: well-controlled: Please continue the same medications. Dyslipidemia: well-controlled.  Please continue the same medication.    Subjective:   Patient here for Medicare annual wellness visit and management of other chronic and acute problems.     Risk factors: advanced age    35 of Physicians Providing Medical Care to Patient:  See "snapshot"   Activities of Daily Living: In your present state of health, do you have any difficulty performing the following activities?:  Preparing food and eating?: No  Bathing yourself: No  Getting dressed: No  Using the toilet:No  Moving around from place to place: No  In the past year have you fallen or had a near fall?:No    Home Safety: Has smoke detector and wears seat belts. No firearms.  Diet and Exercise  Current exercise habits: pt says good Dietary issues discussed: pt reports a healthy diet   Depression Screen  Q1: Over the past two weeks, have you felt down, depressed or hopeless?no  Q2: Over the past two weeks, have you felt little interest or pleasure in doing things? no   The following portions of the patient's history were reviewed and updated as appropriate: allergies, current medications, past family history, past medical history, past social history, past surgical history and problem list.   Review of Systems  Denies hearing loss, and visual loss Objective:   Vision:  Sees opthalmologist Hearing: grossly normal Body mass index:  See vs page Msk: pt easily and quickly performs "get-up-and-go" from a sitting position Cognitive Impairment Assessment: cognition, memory and judgment appear normal.  remembers 3/3 at 5 minutes.  excellent recall.  can easily read and write a sentence.  alert and oriented x 3.    Assessment:   Medicare wellness utd on preventive parameters    Plan:   During the course of the visit the patient was educated and counseled about appropriate screening and  preventive services including:        Fall prevention   Screening mammography: sees gyn for this Bone densitometry screening: is up to date Diabetes screening  Nutrition counseling   Vaccines / LABS are done today.   Patient Instructions (the written plan) was given to the patient.

## 2015-06-22 NOTE — Patient Instructions (Addendum)
i have sent a prescription to your pharmacy, to add "bromocriptine," to help your blood sugar.  please consider these measures for your health:  minimize alcohol.  do not use tobacco products.  have a colonoscopy at least every 10 years from age 72.  Women should have an annual mammogram from age 14.  keep firearms safely stored.  always use seat belts.  have working smoke alarms in your home.  see an eye doctor and dentist regularly.  never drive under the influence of alcohol or drugs (including prescription drugs).   it is critically important to prevent falling down (keep floor areas well-lit, dry, and free of loose objects.  If you have a cane, walker, or wheelchair, you should use it, even for short trips around the house.  Also, try not to rush).   good diet and exercise significantly improve the control of your diabetes.  please let me know if you wish to be referred to a dietician.  high blood sugar is very risky to your health.  you should see an eye doctor and dentist every year.  It is very important to get all recommended vaccinations.  blood tests are requested for you today.  We'll let you know about the results. Please come back for a follow-up appointment in 3 months.

## 2015-06-23 LAB — PTH, INTACT AND CALCIUM
CALCIUM: 9.6 mg/dL (ref 8.4–10.5)
PTH: 90 pg/mL — ABNORMAL HIGH (ref 14–64)

## 2015-06-30 ENCOUNTER — Other Ambulatory Visit: Payer: Self-pay

## 2015-06-30 MED ORDER — SITAGLIPTIN PHOSPHATE 100 MG PO TABS: 100.0000 mg | ORAL_TABLET | Freq: Every day | ORAL | Status: DC

## 2015-06-30 MED ORDER — NIFEDIPINE ER OSMOTIC RELEASE 30 MG PO TB24: 30.0000 mg | ORAL_TABLET | Freq: Every day | ORAL | Status: AC

## 2015-07-29 ENCOUNTER — Other Ambulatory Visit: Payer: Self-pay | Admitting: Endocrinology

## 2015-07-30 ENCOUNTER — Other Ambulatory Visit: Payer: Self-pay | Admitting: Endocrinology

## 2015-08-19 ENCOUNTER — Other Ambulatory Visit: Payer: Self-pay | Admitting: Endocrinology

## 2015-08-25 ENCOUNTER — Other Ambulatory Visit: Payer: Self-pay | Admitting: Endocrinology

## 2015-09-22 ENCOUNTER — Encounter: Payer: Self-pay | Admitting: Endocrinology

## 2015-09-22 ENCOUNTER — Ambulatory Visit (INDEPENDENT_AMBULATORY_CARE_PROVIDER_SITE_OTHER): Payer: Medicare Other | Admitting: Endocrinology

## 2015-09-22 VITALS — BP 122/76 | HR 99 | Temp 98.2°F | Ht 65.0 in | Wt 160.0 lb

## 2015-09-22 DIAGNOSIS — E119 Type 2 diabetes mellitus without complications: Secondary | ICD-10-CM | POA: Diagnosis not present

## 2015-09-22 LAB — POCT GLYCOSYLATED HEMOGLOBIN (HGB A1C): HEMOGLOBIN A1C: 7.7

## 2015-09-22 MED ORDER — COLESTIPOL HCL 1 G PO TABS: 3.0000 g | ORAL_TABLET | Freq: Every day | ORAL | Status: AC

## 2015-09-22 NOTE — Progress Notes (Signed)
Subjective:    Patient ID: Lindsey Jenkins, female    DOB: 1941/03/09, 75 y.o.   MRN: [id number]  HPI The state of at least three ongoing medical problems is addressed today, with interval history of each noted here: Pt returns for f/u of diabetes mellitus: DM type: 2 Dx'ed: 123XX123 Complications: none Therapy: 4 oral meds.   GDM: never DKA: never Severe hypoglycemia: never Pancreatitis: never Other: she has never been on insulin; she did not tolerate prandin (palpitations).  She says she also had palpitations with bromocriptine. She declines pioglitizone, due to risk of edema.   Interval history: Denies weight change.  She requests qd meds only.  She does not take colestipol.  Past Medical History  Diagnosis Date  . 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 DIABETES MELLITUS, TYPE II 04/19/2007  . HYPERLIPIDEMIA 04/19/2007  . HYPERTENSION 04/19/2007  . GASTROESOPHAGEAL REFLUX DISEASE 08/21/2007  . DIVERTICULOSIS, COLON 07/20/2006  . MENOPAUSAL SYNDROME 04/19/2007  . OSTEOPOROSIS 04/19/2007  . BACK PAIN, LUMBAR 05/21/2009  . HIP PAIN 05/24/2010    Past Surgical History  Procedure Laterality Date  . Breast biopsy  1990's    benign    Social History   Social History  . Marital Status: Married    Spouse Name: N/A  . Number of Children: N/A  . 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 Smoking status: Never Smoker   . Smokeless tobacco: Never Used  . Alcohol Use: Not on file  . Drug Use: Not on file  . Sexual Activity: Not on file   Other Topics Concern  . Not on file   Social History Narrative    Current Outpatient Prescriptions on File Prior to Visit  Medication Sig Dispense Refill  . aspirin 325 MG tablet Take 325 mg by mouth daily.      Marland Kitchen glucose blood (ACCU-CHEK COMPACT TEST DRUM) test strip Two times a day and lancets dx 250.00 100 each 11  . JANUVIA 100 MG tablet take 1 tablet by  mouth once daily 30 tablet 1  . lisinopril (PRINIVIL,ZESTRIL) 40 MG tablet take 1 tablet by mouth once daily 60 tablet 11  . metFORMIN (GLUCOPHAGE) 500 MG tablet take 2 tablets by mouth twice a day with meals 180 tablet 11  . NIFEdipine (NIFEDICAL XL) 30 MG 24 hr tablet Take 1 tablet (30 mg total) by mouth daily. 30 tablet 11  . simvastatin (ZOCOR) 40 MG tablet take 2 tablets by mouth at bedtime 60 tablet 11  . traMADol (ULTRAM) 50 MG tablet take 1 tablet by mouth every 4 hours if needed for pain 50 tablet 5   No current facility-administered medications on file prior to visit.    No Known Allergies  Family History  Problem Relation Age of Onset  . Cancer Neg Hx     BP 122/76 mmHg  Pulse 99  Temp(Src) 98.2 F (36.8 C)  Ht 5\' 5"  (1.651 m)  Wt 160 lb (72.576 kg)  BMI 26.63 kg/m2  SpO2 97%  Review of Systems She denies hypoglycemia.      Objective:   Physical Exam VITAL SIGNS:  See vs page GENERAL: no distress Pulses: dorsalis pedis intact bilat.   MSK: no deformity of the feet CV: no leg edema Skin:  no ulcer on the feet.  normal color and temp on the feet. Neuro: sensation is intact to touch on the feet.  Ext: There is bilateral onychomycosis of the toenails    A1c=7.7%    Assessment & Plan:  DM: Needs increased rx, if it can be done with a regimen that avoids or minimizes hypoglycemia.   Patient is advised the following: Patient Instructions  i have sent a prescription to your pharmacy, to resume the "colestipol" to help your blood sugar.  Please see linda, to learn about insulin, just in case. check your blood sugar once a day.  vary the time of day when you check, between before the 3 meals, and at bedtime.  also check if you have symptoms of your blood sugar being too high or too low.  please keep a record of the readings and bring it to your next appointment here (or you can bring the meter itself).  You can write it on any piece of paper.  please call us  sooner if your blood sugar goes below 70, or if you have a lot of readings over 200. Please come back for a follow-up appointment in 3 months.

## 2015-09-22 NOTE — Patient Instructions (Addendum)
i have sent a prescription to your pharmacy, to resume the "colestipol" to help your blood sugar.  Please see linda, to learn about insulin, just in case. check your blood sugar once a day.  vary the time of day when you check, between before the 3 meals, and at bedtime.  also check if you have symptoms of your blood sugar being too high or too low.  please keep a record of the readings and bring it to your next appointment here (or you can bring the meter itself).  You can write it on any piece of paper.  please call us sooner if your blood sugar goes below 70, or if you have a lot of readings over 200. Please come back for a follow-up appointment in 3 months.

## 2015-11-08 ENCOUNTER — Other Ambulatory Visit: Payer: Self-pay | Admitting: Endocrinology

## 2015-12-22 ENCOUNTER — Encounter: Payer: Self-pay | Admitting: Endocrinology

## 2015-12-22 ENCOUNTER — Ambulatory Visit (INDEPENDENT_AMBULATORY_CARE_PROVIDER_SITE_OTHER): Payer: Medicare Other | Admitting: Endocrinology

## 2015-12-22 VITALS — BP 106/80 | HR 104 | Temp 98.0°F | Ht 65.0 in | Wt 158.0 lb

## 2015-12-22 DIAGNOSIS — E119 Type 2 diabetes mellitus without complications: Secondary | ICD-10-CM | POA: Diagnosis not present

## 2015-12-22 LAB — POCT GLYCOSYLATED HEMOGLOBIN (HGB A1C): HEMOGLOBIN A1C: 8.9

## 2015-12-22 MED ORDER — GLIMEPIRIDE 1 MG PO TABS: 0.5000 mg | ORAL_TABLET | Freq: Every day | ORAL | Status: AC

## 2015-12-22 NOTE — Patient Instructions (Addendum)
i have sent a prescription to your pharmacy, to add "glimiperide." check your blood sugar once a day.  vary the time of day when you check, between before the 3 meals, and at bedtime.  also check if you have symptoms of your blood sugar being too high or too low.  please keep a record of the readings and bring it to your next appointment here (or you can bring the meter itself).  You can write it on any piece of paper.  please call us sooner if your blood sugar goes below 70, or if you have a lot of readings over 200. Please come back for a follow-up appointment in 3 months.

## 2015-12-22 NOTE — Progress Notes (Signed)
Subjective:    Patient ID: Lindsey Jenkins, female    DOB: [birth year], 75 y.o.   MRN: [id number]  HPI Pt returns for f/u of diabetes mellitus: DM type: 2 Dx'ed: 123XX123 Complications: none.  Therapy: 3 oral meds.   GDM: never DKA: never Severe hypoglycemia: never Pancreatitis: never Other: she has never been on insulin, but she has learned how; she did not tolerate prandin (palpitations).  She says she also had palpitations with bromocriptine. She declines pioglitizone, due to risk of edema.   Interval history: Denies weight change.  She requests qd meds only.  no cbg record, but states cbg's vary from 100-168.  She says she cannot take any more brand name meds.   Past Medical History  Diagnosis Date  . 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 DIABETES MELLITUS, TYPE II 04/19/2007  . HYPERLIPIDEMIA 04/19/2007  . HYPERTENSION 04/19/2007  . GASTROESOPHAGEAL REFLUX DISEASE 08/21/2007  . DIVERTICULOSIS, COLON 07/20/2006  . MENOPAUSAL SYNDROME 04/19/2007  . OSTEOPOROSIS 04/19/2007  . BACK PAIN, LUMBAR 05/21/2009  . HIP PAIN 05/24/2010    Past Surgical History  Procedure Laterality Date  . Breast biopsy  1990's    benign    Social History   Social History  . Marital Status: Married    Spouse Name: N/A  . Number of Children: N/A  . 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 Smoking status: Never Smoker   . Smokeless tobacco: Never Used  . Alcohol Use: Not on file  . Drug Use: Not on file  . Sexual Activity: Not on file   Other Topics Concern  . Not on file   Social History Narrative    Current Outpatient Prescriptions on File Prior to Visit  Medication Sig Dispense Refill  . aspirin 325 MG tablet Take 325 mg by mouth daily.      . colestipol (COLESTID) 1 g tablet Take 3 tablets (3 g total) by mouth daily. 90 tablet 11  . glucose blood (ACCU-CHEK COMPACT TEST DRUM) test strip Two times a day and  lancets dx 250.00 100 each 11  . JANUVIA 100 MG tablet take 1 tablet by mouth once daily 30 tablet 1  . lisinopril (PRINIVIL,ZESTRIL) 40 MG tablet take 1 tablet by mouth once daily 60 tablet 11  . metFORMIN (GLUCOPHAGE) 500 MG tablet take 2 tablets by mouth twice a day with meals 180 tablet 11  . NIFEdipine (NIFEDICAL XL) 30 MG 24 hr tablet Take 1 tablet (30 mg total) by mouth daily. 30 tablet 11  . simvastatin (ZOCOR) 40 MG tablet take 2 tablets by mouth at bedtime 60 tablet 11  . traMADol (ULTRAM) 50 MG tablet take 1 tablet by mouth every 4 hours if needed for pain 50 tablet 5   No current facility-administered medications on file prior to visit.    No Known Allergies  Family History  Problem Relation Age of Onset  . Cancer Neg Hx     BP 106/80 mmHg  Pulse 104  Temp(Src) 98 F (36.7 C) (Oral)  Ht 5\' 5"  (1.651 m)  Wt 158 lb (71.668 kg)  BMI 26.29 kg/m2  SpO2 91%  Review of Systems She denies hypoglycemia    Objective:   Physical Exam VITAL SIGNS:  See vs page GENERAL: no distress Pulses: dorsalis pedis intact bilat.   MSK: no deformity of the feet CV: no leg edema Skin:  no  ulcer on the feet.  normal color and temp on the feet. Neuro: sensation is intact to touch on the feet.    A1c=8.9%    Assessment & Plan:  Type 2 DM: worse.  She declines insulin.  I advised her of risks.   Patient is advised the following: Patient Instructions  i have sent a prescription to your pharmacy, to add "glimiperide." check your blood sugar once a day.  vary the time of day when you check, between before the 3 meals, and at bedtime.  also check if you have symptoms of your blood sugar being too high or too low.  please keep a record of the readings and bring it to your next appointment here (or you can bring the meter itself).  You can write it on any piece of paper.  please call us sooner if your blood sugar goes below 70, or if you have a lot of readings over 200. Please come back for  a follow-up appointment in 3 months.      Renato Shin

## 2015-12-28 ENCOUNTER — Other Ambulatory Visit: Payer: Self-pay | Admitting: Obstetrics & Gynecology

## 2015-12-31 LAB — CYTOLOGY - PAP

## 2016-01-05 ENCOUNTER — Other Ambulatory Visit: Payer: Self-pay | Admitting: General Surgery

## 2016-01-23 ENCOUNTER — Other Ambulatory Visit: Payer: Self-pay | Admitting: Endocrinology

## 2016-01-28 LAB — HM DIABETES EYE EXAM

## 2016-02-09 ENCOUNTER — Encounter (HOSPITAL_COMMUNITY): Payer: Self-pay

## 2016-02-09 ENCOUNTER — Encounter (HOSPITAL_COMMUNITY)
Admission: RE | Admit: 2016-02-09 | Discharge: 2016-02-09 | Disposition: A | Discharge status: Home / Self Care | Payer: Medicare Other | Source: Ambulatory Visit | Attending: General Surgery | Admitting: General Surgery

## 2016-02-09 DIAGNOSIS — Z01810 Encounter for preprocedural cardiovascular examination: Secondary | ICD-10-CM | POA: Insufficient documentation

## 2016-02-09 DIAGNOSIS — I10 Essential (primary) hypertension: Secondary | ICD-10-CM | POA: Diagnosis not present

## 2016-02-09 DIAGNOSIS — Z01812 Encounter for preprocedural laboratory examination: Secondary | ICD-10-CM | POA: Diagnosis present

## 2016-02-09 DIAGNOSIS — K219 Gastro-esophageal reflux disease without esophagitis: Secondary | ICD-10-CM | POA: Diagnosis not present

## 2016-02-09 DIAGNOSIS — E119 Type 2 diabetes mellitus without complications: Secondary | ICD-10-CM | POA: Diagnosis not present

## 2016-02-09 DIAGNOSIS — E785 Hyperlipidemia, unspecified: Secondary | ICD-10-CM | POA: Diagnosis not present

## 2016-02-09 HISTORY — DX: Unspecified osteoarthritis, unspecified site: M19.90

## 2016-02-09 HISTORY — DX: Unspecified cataract: H26.9

## 2016-02-09 LAB — GLUCOSE, CAPILLARY: Glucose-Capillary: 203 mg/dL — ABNORMAL HIGH (ref 65–99)

## 2016-02-09 LAB — BASIC METABOLIC PANEL
Anion gap: 7 (ref 5–15)
BUN: 12 mg/dL (ref 6–20)
CHLORIDE: 108 mmol/L (ref 101–111)
CO2: 25 mmol/L (ref 22–32)
CREATININE: 0.85 mg/dL (ref 0.44–1.00)
Calcium: 10 mg/dL (ref 8.9–10.3)
GFR calc non Af Amer: >60 mL/min (ref >60)
GLUCOSE: 193 mg/dL — AB (ref 65–99)
Potassium: 4.3 mmol/L (ref 3.5–5.1)
Sodium: 140 mmol/L (ref 135–145)

## 2016-02-09 LAB — CBC
HCT: 41.6 % (ref 36.0–46.0)
HEMOGLOBIN: 13.8 g/dL (ref 12.0–15.0)
MCH: 26.1 pg (ref 26.0–34.0)
MCHC: 33.2 g/dL (ref 30.0–36.0)
MCV: 78.6 fL (ref 78.0–100.0)
PLATELETS: 229 10*3/uL (ref 150–400)
RBC: 5.29 MIL/uL — AB (ref 3.87–5.11)
RDW: 14.4 % (ref 11.5–15.5)
WBC: 5.8 10*3/uL (ref 4.0–10.5)

## 2016-02-09 NOTE — Pre-Procedure Instructions (Signed)
Lindsey Jenkins  02/09/2016      [id number], [place] [address] Phone: [phone] Fax: [phone]    Your procedure is scheduled on 02/17/2016.  Report to [facility] Admitting at   6:30 A.M.  Call this number if you have problems the morning of surgery:  [phone]   Remember:  Do not eat food or drink liquids after midnight. On Tuesday   Take these medicines the morning of surgery with A SIP OF WATER : NOTHING   Do not wear jewelry, make-up or nail polish.   Do not wear lotions, powders, or perfumes.  You may wear deoderant.   Do not shave 48 hours prior to surgery.     Do not bring valuables to the hospital.   [facility] is not responsible for any belongings or valuables.  Contacts, dentures or bridgework may not be worn into surgery.  Leave your suitcase in the car.  After surgery it may be brought to your room.  For patients admitted to the hospital, discharge time will be determined by your treatment team.  Patients discharged the day of surgery will not be allowed to drive home.   Name and phone number of your driver:   With spouse   Special instructions:    How to Manage Your Diabetes Before and After Surgery  Why is it important to control my blood sugar before and after surgery? . Improving blood sugar levels before and after surgery helps healing and can limit problems. . A way of improving blood sugar control is eating a healthy diet by: o  Eating less sugar and carbohydrates o  Increasing activity/exercise o  Talking with your doctor about reaching your blood sugar goals . High blood sugars (greater than 180 mg/dL) can raise your risk of infections and slow your recovery, so you will need to focus on controlling your diabetes during the weeks before surgery. . Make sure that the doctor who takes care of your diabetes knows about your planned surgery  including the date and location.  How do I manage my blood sugar before surgery? . Check your blood sugar at least 4 times a day, starting 2 days before surgery, to make sure that the level is not too high or low. o Check your blood sugar the morning of your surgery when you wake up and every 2 hours until you get to the Short Stay unit. . If your blood sugar is less than 70 mg/dL, you will need to treat for low blood sugar: o Do not take insulin. o Treat a low blood sugar (less than 70 mg/dL) with  cup of clear juice (cranberry or apple), 4 glucose tablets, OR glucose gel. o Recheck blood sugar in 15 minutes after treatment (to make sure it is greater than 70 mg/dL). If your blood sugar is not greater than 70 mg/dL on recheck, call [phone] for further instructions. . Report your blood sugar to the short stay nurse when you get to Short Stay.  . If you are admitted to the hospital after surgery: o Your blood sugar will be checked by the staff and you will probably be given insulin after surgery (instead of oral diabetes medicines) to make sure you have good blood sugar levels. o The goal for blood sugar control after surgery is 80-180 mg/dL.              WHAT  DO I DO ABOUT MY DIABETES MEDICATION?   Marland Kitchen Do not take oral diabetes medicines (pills) the morning of surgery.  .        .   Other Instructions:  Special Instructions: [place] - Preparing for Surgery  Before surgery, you can play an important role.  Because skin is not sterile, your skin needs to be as free of germs as possible.  You can reduce the number of germs on you skin by washing with CHG (chlorahexidine gluconate) soap before surgery.  CHG is an antiseptic cleaner which kills germs and bonds with the skin to continue killing germs even after washing.  Please DO NOT use if you have an allergy to CHG or antibacterial soaps.  If your skin becomes reddened/irritated stop using the CHG and inform your nurse  when you arrive at Short Stay.  Do not shave (including legs and underarms) for at least 48 hours prior to the first CHG shower.  You may shave your face.  Please follow these instructions carefully:   1.  Shower with CHG Soap the night before surgery and the  morning of Surgery.  2.  If you choose to wash your hair, wash your hair first as usual with your  normal shampoo.  3.  After you shampoo, rinse your hair and body thoroughly to remove the  Shampoo.  4.  Use CHG as you would any other liquid soap.  You can apply chg directly to the skin and wash gently with scrungie or a clean washcloth.  5.  Apply the CHG Soap to your body ONLY FROM THE NECK DOWN.    Do not use on open wounds or open sores.  Avoid contact with your eyes, ears, mouth and genitals (private parts).  Wash genitals (private parts)   with your normal soap.  6.  Wash thoroughly, paying special attention to the area where your surgery will be performed.  7.  Thoroughly rinse your body with warm water from the neck down.  8.  DO NOT shower/wash with your normal soap after using and rinsing off   the CHG Soap.  9.  Pat yourself dry with a clean towel.            10.  Wear clean pajamas.            11.  Place clean sheets on your bed the night of your first shower and do not sleep with pets.  Day of Surgery  Do not apply any lotions/deodorants the morning of surgery.  Please wear clean clothes to the hospital/surgery center.          Patient Signature:  Date:   Nurse Signature:  Date:   Reviewed and Endorsed by [facility] Patient Education Committee, August 2015  Please read over the following fact sheets that you were given. Pain Booklet, Coughing and Deep Breathing and Surgical Site Infection Prevention

## 2016-02-10 ENCOUNTER — Encounter: Payer: Self-pay | Admitting: Endocrinology

## 2016-02-12 ENCOUNTER — Telehealth: Payer: Self-pay | Admitting: Endocrinology

## 2016-02-12 MED ORDER — GLUCOSE BLOOD VI STRP: ORAL_STRIP | Status: AC

## 2016-02-12 MED ORDER — ACCU-CHEK MULTICLIX LANCETS MISC: Status: AC

## 2016-02-12 NOTE — Telephone Encounter (Signed)
Pt needs updated glucose test strips and lancets called into [facility] on [address]

## 2016-02-12 NOTE — Telephone Encounter (Signed)
Rx submitted for test strips and lancets.

## 2016-02-16 MED ORDER — CEFAZOLIN SODIUM-DEXTROSE 2-4 GM/100ML-% IV SOLN
2.0000 g | INTRAVENOUS | Status: AC
  Administered 2016-02-17: 2 g via INTRAVENOUS
  Filled 2016-02-16: qty 100

## 2016-02-17 ENCOUNTER — Encounter (HOSPITAL_COMMUNITY)
Admission: RE | Disposition: A | Discharge status: Home / Self Care | Payer: Self-pay | Source: Ambulatory Visit | Attending: General Surgery

## 2016-02-17 ENCOUNTER — Ambulatory Visit (HOSPITAL_COMMUNITY): Payer: Medicare Other | Admitting: Anesthesiology

## 2016-02-17 ENCOUNTER — Inpatient Hospital Stay (HOSPITAL_COMMUNITY)
Admission: RE | Admit: 2016-02-17 | Discharge: 2016-03-03 | DRG: 353 | Disposition: A | Discharge status: Home / Self Care | Payer: Medicare Other | Source: Ambulatory Visit | Attending: General Surgery | Admitting: General Surgery

## 2016-02-17 ENCOUNTER — Encounter (HOSPITAL_COMMUNITY): Payer: Self-pay | Admitting: Surgery

## 2016-02-17 DIAGNOSIS — E119 Type 2 diabetes mellitus without complications: Secondary | ICD-10-CM | POA: Diagnosis present

## 2016-02-17 DIAGNOSIS — E042 Nontoxic multinodular goiter: Secondary | ICD-10-CM | POA: Diagnosis present

## 2016-02-17 DIAGNOSIS — R1110 Vomiting, unspecified: Secondary | ICD-10-CM

## 2016-02-17 DIAGNOSIS — F502 Bulimia nervosa: Secondary | ICD-10-CM | POA: Diagnosis not present

## 2016-02-17 DIAGNOSIS — R140 Abdominal distension (gaseous): Secondary | ICD-10-CM

## 2016-02-17 DIAGNOSIS — Y95 Nosocomial condition: Secondary | ICD-10-CM

## 2016-02-17 DIAGNOSIS — E785 Hyperlipidemia, unspecified: Secondary | ICD-10-CM | POA: Diagnosis present

## 2016-02-17 DIAGNOSIS — E46 Unspecified protein-calorie malnutrition: Secondary | ICD-10-CM | POA: Diagnosis not present

## 2016-02-17 DIAGNOSIS — K567 Ileus, unspecified: Principal | ICD-10-CM | POA: Diagnosis present

## 2016-02-17 DIAGNOSIS — J189 Pneumonia, unspecified organism: Secondary | ICD-10-CM

## 2016-02-17 DIAGNOSIS — E875 Hyperkalemia: Secondary | ICD-10-CM | POA: Diagnosis not present

## 2016-02-17 DIAGNOSIS — G8918 Other acute postprocedural pain: Secondary | ICD-10-CM | POA: Diagnosis not present

## 2016-02-17 DIAGNOSIS — M1990 Unspecified osteoarthritis, unspecified site: Secondary | ICD-10-CM | POA: Diagnosis present

## 2016-02-17 DIAGNOSIS — E8770 Fluid overload, unspecified: Secondary | ICD-10-CM | POA: Diagnosis not present

## 2016-02-17 DIAGNOSIS — N179 Acute kidney failure, unspecified: Secondary | ICD-10-CM | POA: Diagnosis not present

## 2016-02-17 DIAGNOSIS — I493 Ventricular premature depolarization: Secondary | ICD-10-CM | POA: Diagnosis not present

## 2016-02-17 DIAGNOSIS — K439 Ventral hernia without obstruction or gangrene: Secondary | ICD-10-CM | POA: Diagnosis not present

## 2016-02-17 DIAGNOSIS — I10 Essential (primary) hypertension: Secondary | ICD-10-CM | POA: Diagnosis present

## 2016-02-17 DIAGNOSIS — K219 Gastro-esophageal reflux disease without esophagitis: Secondary | ICD-10-CM | POA: Diagnosis present

## 2016-02-17 DIAGNOSIS — Z7984 Long term (current) use of oral hypoglycemic drugs: Secondary | ICD-10-CM

## 2016-02-17 DIAGNOSIS — Z79899 Other long term (current) drug therapy: Secondary | ICD-10-CM

## 2016-02-17 DIAGNOSIS — K9189 Other postprocedural complications and disorders of digestive system: Secondary | ICD-10-CM

## 2016-02-17 DIAGNOSIS — J9601 Acute respiratory failure with hypoxia: Secondary | ICD-10-CM | POA: Diagnosis not present

## 2016-02-17 DIAGNOSIS — J181 Lobar pneumonia, unspecified organism: Secondary | ICD-10-CM

## 2016-02-17 DIAGNOSIS — R000 Tachycardia, unspecified: Secondary | ICD-10-CM | POA: Diagnosis present

## 2016-02-17 HISTORY — PX: VENTRAL HERNIA REPAIR: SHX424

## 2016-02-17 HISTORY — PX: INSERTION OF MESH: SHX5868

## 2016-02-17 LAB — GLUCOSE, CAPILLARY
GLUCOSE-CAPILLARY: 140 mg/dL — AB (ref 65–99)
GLUCOSE-CAPILLARY: 194 mg/dL — AB (ref 65–99)
GLUCOSE-CAPILLARY: 157 mg/dL — AB (ref 65–99)
Glucose-Capillary: 207 mg/dL — ABNORMAL HIGH (ref 65–99)
Glucose-Capillary: 96 mg/dL (ref 65–99)

## 2016-02-17 SURGERY — REPAIR, HERNIA, VENTRAL, LAPAROSCOPIC
Anesthesia: General | Site: Abdomen

## 2016-02-17 MED ORDER — INSULIN ASPART 100 UNIT/ML ~~LOC~~ SOLN
0.0000 [IU] | Freq: Three times a day (TID) | SUBCUTANEOUS | Status: DC
  Administered 2016-02-17 – 2016-02-18 (×2): 2 [IU] via SUBCUTANEOUS
  Administered 2016-02-18 – 2016-02-20 (×6): 1 [IU] via SUBCUTANEOUS
  Administered 2016-02-21: 2 [IU] via SUBCUTANEOUS
  Administered 2016-02-21: 1 [IU] via SUBCUTANEOUS
  Administered 2016-02-21: 2 [IU] via SUBCUTANEOUS
  Administered 2016-02-22: 1 [IU] via SUBCUTANEOUS
  Administered 2016-02-22 – 2016-02-24 (×4): 2 [IU] via SUBCUTANEOUS
  Administered 2016-02-24 – 2016-02-25 (×2): 1 [IU] via SUBCUTANEOUS

## 2016-02-17 MED ORDER — ROCURONIUM BROMIDE 50 MG/5ML IV SOLN
INTRAVENOUS | Status: AC
  Filled 2016-02-17: qty 1

## 2016-02-17 MED ORDER — CHLORHEXIDINE GLUCONATE CLOTH 2 % EX PADS: 6.0000 | MEDICATED_PAD | Freq: Once | CUTANEOUS | Status: DC

## 2016-02-17 MED ORDER — PROPOFOL 10 MG/ML IV BOLUS
INTRAVENOUS | Status: AC
  Filled 2016-02-17: qty 20

## 2016-02-17 MED ORDER — SODIUM CHLORIDE 0.9 % IR SOLN
Status: DC | PRN
  Administered 2016-02-17: 1000 mL

## 2016-02-17 MED ORDER — LISINOPRIL 40 MG PO TABS
40.0000 mg | ORAL_TABLET | Freq: Every day | ORAL | Status: DC
  Administered 2016-02-17 – 2016-02-23 (×7): 40 mg via ORAL
  Filled 2016-02-17 (×7): qty 1

## 2016-02-17 MED ORDER — NIFEDIPINE ER OSMOTIC RELEASE 30 MG PO TB24
30.0000 mg | ORAL_TABLET | Freq: Every day | ORAL | Status: DC
  Administered 2016-02-17 – 2016-02-25 (×9): 30 mg via ORAL
  Filled 2016-02-17 (×9): qty 1

## 2016-02-17 MED ORDER — GLIMEPIRIDE 1 MG PO TABS
0.5000 mg | ORAL_TABLET | Freq: Every day | ORAL | Status: DC
  Administered 2016-02-18 – 2016-02-24 (×7): 0.5 mg via ORAL
  Filled 2016-02-17 (×9): qty 0.5

## 2016-02-17 MED ORDER — HYDROMORPHONE HCL 1 MG/ML IJ SOLN
INTRAMUSCULAR | Status: AC
  Administered 2016-02-17: 0.5 mg via INTRAVENOUS
  Filled 2016-02-17: qty 1

## 2016-02-17 MED ORDER — ONDANSETRON HCL 4 MG/2ML IJ SOLN
INTRAMUSCULAR | Status: DC | PRN
  Administered 2016-02-17: 4 mg via INTRAVENOUS

## 2016-02-17 MED ORDER — NEOSTIGMINE METHYLSULFATE 10 MG/10ML IV SOLN
INTRAVENOUS | Status: DC | PRN
  Administered 2016-02-17: 4 mg via INTRAVENOUS

## 2016-02-17 MED ORDER — BUPIVACAINE-EPINEPHRINE 0.25% -1:200000 IJ SOLN
INTRAMUSCULAR | Status: DC | PRN
  Administered 2016-02-17: 19 mL

## 2016-02-17 MED ORDER — OXYCODONE-ACETAMINOPHEN 5-325 MG PO TABS
1.0000 | ORAL_TABLET | ORAL | Status: DC | PRN
  Administered 2016-02-17 – 2016-02-24 (×18): 2 via ORAL
  Filled 2016-02-17 (×18): qty 2

## 2016-02-17 MED ORDER — OXYCODONE-ACETAMINOPHEN 5-325 MG PO TABS: 1.0000 | ORAL_TABLET | ORAL | 0 refills | Status: AC | PRN

## 2016-02-17 MED ORDER — METHOCARBAMOL 500 MG PO TABS
500.0000 mg | ORAL_TABLET | Freq: Four times a day (QID) | ORAL | Status: DC | PRN
  Administered 2016-02-18 – 2016-03-03 (×2): 500 mg via ORAL
  Filled 2016-02-17 (×2): qty 1

## 2016-02-17 MED ORDER — COLESTIPOL HCL 1 G PO TABS
3.0000 g | ORAL_TABLET | Freq: Every day | ORAL | Status: DC
  Administered 2016-02-17 – 2016-02-24 (×8): 3 g via ORAL
  Filled 2016-02-17 (×9): qty 3

## 2016-02-17 MED ORDER — 0.9 % SODIUM CHLORIDE (POUR BTL) OPTIME
TOPICAL | Status: DC | PRN
  Administered 2016-02-17: 1000 mL

## 2016-02-17 MED ORDER — METFORMIN HCL 500 MG PO TABS
500.0000 mg | ORAL_TABLET | Freq: Two times a day (BID) | ORAL | Status: DC
  Administered 2016-02-17 – 2016-02-24 (×15): 500 mg via ORAL
  Filled 2016-02-17 (×15): qty 1

## 2016-02-17 MED ORDER — ONDANSETRON 4 MG PO TBDP: 4.0000 mg | ORAL_TABLET | Freq: Four times a day (QID) | ORAL | Status: DC | PRN

## 2016-02-17 MED ORDER — BUPIVACAINE-EPINEPHRINE (PF) 0.25% -1:200000 IJ SOLN
INTRAMUSCULAR | Status: AC
  Filled 2016-02-17: qty 30

## 2016-02-17 MED ORDER — LINAGLIPTIN 5 MG PO TABS
5.0000 mg | ORAL_TABLET | Freq: Every day | ORAL | Status: DC
  Administered 2016-02-17 – 2016-02-24 (×8): 5 mg via ORAL
  Filled 2016-02-17 (×8): qty 1

## 2016-02-17 MED ORDER — ROCURONIUM BROMIDE 100 MG/10ML IV SOLN
INTRAVENOUS | Status: DC | PRN
  Administered 2016-02-17: 40 mg via INTRAVENOUS

## 2016-02-17 MED ORDER — HYDROMORPHONE HCL 1 MG/ML IJ SOLN
0.2500 mg | INTRAMUSCULAR | Status: DC | PRN
  Administered 2016-02-17 (×4): 0.5 mg via INTRAVENOUS

## 2016-02-17 MED ORDER — LIDOCAINE HCL (CARDIAC) 20 MG/ML IV SOLN
INTRAVENOUS | Status: DC | PRN
  Administered 2016-02-17: 80 mg via INTRAVENOUS

## 2016-02-17 MED ORDER — HEPARIN SODIUM (PORCINE) 5000 UNIT/ML IJ SOLN
5000.0000 [IU] | Freq: Three times a day (TID) | INTRAMUSCULAR | Status: DC
  Administered 2016-02-18 – 2016-03-03 (×42): 5000 [IU] via SUBCUTANEOUS
  Filled 2016-02-17 (×41): qty 1

## 2016-02-17 MED ORDER — ONDANSETRON HCL 4 MG/2ML IJ SOLN
INTRAMUSCULAR | Status: AC
  Filled 2016-02-17: qty 2

## 2016-02-17 MED ORDER — MORPHINE SULFATE (PF) 2 MG/ML IV SOLN
1.0000 mg | INTRAVENOUS | Status: DC | PRN
  Administered 2016-02-24 – 2016-03-03 (×20): 2 mg via INTRAVENOUS
  Filled 2016-02-17 (×20): qty 1

## 2016-02-17 MED ORDER — ASPIRIN 325 MG PO TABS
325.0000 mg | ORAL_TABLET | Freq: Every day | ORAL | Status: DC
  Administered 2016-02-18 – 2016-02-24 (×7): 325 mg via ORAL
  Filled 2016-02-17 (×7): qty 1

## 2016-02-17 MED ORDER — PHENYLEPHRINE HCL 10 MG/ML IJ SOLN
INTRAVENOUS | Status: DC | PRN
  Administered 2016-02-17: 40 ug/min via INTRAVENOUS

## 2016-02-17 MED ORDER — SIMVASTATIN 40 MG PO TABS
80.0000 mg | ORAL_TABLET | Freq: Every day | ORAL | Status: DC
  Administered 2016-02-17 – 2016-02-23 (×7): 80 mg via ORAL
  Filled 2016-02-17 (×8): qty 2

## 2016-02-17 MED ORDER — FAMOTIDINE 20 MG PO TABS
20.0000 mg | ORAL_TABLET | Freq: Two times a day (BID) | ORAL | Status: DC
  Administered 2016-02-17 – 2016-02-24 (×15): 20 mg via ORAL
  Filled 2016-02-17 (×14): qty 1

## 2016-02-17 MED ORDER — FENTANYL CITRATE (PF) 250 MCG/5ML IJ SOLN
INTRAMUSCULAR | Status: AC
  Filled 2016-02-17: qty 5

## 2016-02-17 MED ORDER — ONDANSETRON HCL 4 MG/2ML IJ SOLN
4.0000 mg | Freq: Four times a day (QID) | INTRAMUSCULAR | Status: DC | PRN
  Administered 2016-02-19 – 2016-02-24 (×4): 4 mg via INTRAVENOUS
  Filled 2016-02-17 (×4): qty 2

## 2016-02-17 MED ORDER — GLYCOPYRROLATE 0.2 MG/ML IJ SOLN
INTRAMUSCULAR | Status: DC | PRN
  Administered 2016-02-17: 0.6 mg via INTRAVENOUS

## 2016-02-17 MED ORDER — POTASSIUM CHLORIDE IN NACL 20-0.9 MEQ/L-% IV SOLN
INTRAVENOUS | Status: DC
  Administered 2016-02-17 – 2016-02-21 (×6): via INTRAVENOUS
  Filled 2016-02-17 (×7): qty 1000

## 2016-02-17 MED ORDER — PROPOFOL 10 MG/ML IV BOLUS
INTRAVENOUS | Status: DC | PRN
  Administered 2016-02-17: 170 mg via INTRAVENOUS

## 2016-02-17 MED ORDER — FAMOTIDINE IN NACL 20-0.9 MG/50ML-% IV SOLN
20.0000 mg | Freq: Two times a day (BID) | INTRAVENOUS | Status: DC
  Filled 2016-02-17: qty 50

## 2016-02-17 MED ORDER — LACTATED RINGERS IV SOLN
INTRAVENOUS | Status: DC | PRN
  Administered 2016-02-17 (×2): via INTRAVENOUS

## 2016-02-17 MED ORDER — FENTANYL CITRATE (PF) 100 MCG/2ML IJ SOLN
INTRAMUSCULAR | Status: DC | PRN
  Administered 2016-02-17: 50 ug via INTRAVENOUS
  Administered 2016-02-17: 100 ug via INTRAVENOUS

## 2016-02-17 MED ORDER — TRAMADOL HCL 50 MG PO TABS: 50.0000 mg | ORAL_TABLET | Freq: Four times a day (QID) | ORAL | Status: DC | PRN

## 2016-02-17 SURGICAL SUPPLY — 51 items
APPLIER CLIP LOGIC TI 5 (MISCELLANEOUS) IMPLANT
APPLIER CLIP ROT 10 11.4 M/L (STAPLE)
APR CLP MED LRG 11.4X10 (STAPLE)
APR CLP MED LRG 33X5 (MISCELLANEOUS)
BINDER ABD UNIV 12 45-62 (WOUND CARE) IMPLANT
BINDER ABDOMINAL 46IN 62IN (WOUND CARE) ×3
BNDG GAUZE ELAST 4 BULKY (GAUZE/BANDAGES/DRESSINGS) IMPLANT
CANISTER SUCTION 2500CC (MISCELLANEOUS) ×2 IMPLANT
CHLORAPREP W/TINT 26ML (MISCELLANEOUS) ×3 IMPLANT
CLIP APPLIE ROT 10 11.4 M/L (STAPLE) IMPLANT
COVER SURGICAL LIGHT HANDLE (MISCELLANEOUS) ×3 IMPLANT
DEVICE SECURE STRAP 25 ABSORB (INSTRUMENTS) ×5 IMPLANT
DEVICE TROCAR PUNCTURE CLOSURE (ENDOMECHANICALS) ×3 IMPLANT
DRAPE INCISE IOBAN 66X45 STRL (DRAPES) ×3 IMPLANT
DRAPE LAPAROSCOPIC ABDOMINAL (DRAPES) ×3 IMPLANT
ELECT CAUTERY BLADE 6.4 (BLADE) ×3 IMPLANT
ELECT REM PT RETURN 9FT ADLT (ELECTROSURGICAL) ×3
ELECTRODE REM PT RTRN 9FT ADLT (ELECTROSURGICAL) ×1 IMPLANT
GAUZE SPONGE 4X4 12PLY STRL (GAUZE/BANDAGES/DRESSINGS) ×2 IMPLANT
GLOVE BIO SURGEON STRL SZ7.5 (GLOVE) ×5 IMPLANT
GLOVE BIOGEL PI IND STRL 7.0 (GLOVE) IMPLANT
GLOVE BIOGEL PI INDICATOR 7.0 (GLOVE) ×2
GLOVE SURG SS PI 7.0 STRL IVOR (GLOVE) ×2 IMPLANT
GOWN STRL REUS W/ TWL LRG LVL3 (GOWN DISPOSABLE) ×3 IMPLANT
GOWN STRL REUS W/TWL LRG LVL3 (GOWN DISPOSABLE) ×9
KIT BASIN OR (CUSTOM PROCEDURE TRAY) ×3 IMPLANT
KIT ROOM TURNOVER OR (KITS) ×3 IMPLANT
LIQUID BAND (GAUZE/BANDAGES/DRESSINGS) ×3 IMPLANT
MARKER SKIN DUAL TIP RULER LAB (MISCELLANEOUS) ×3 IMPLANT
MESH VENTRALIGHT ST 6X8 (Mesh Specialty) ×3 IMPLANT
MESH VENTRLGHT ELLIPSE 8X6XMFL (Mesh Specialty) IMPLANT
NDL SPNL 22GX3.5 QUINCKE BK (NEEDLE) ×1 IMPLANT
NEEDLE SPNL 22GX3.5 QUINCKE BK (NEEDLE) ×3 IMPLANT
NS IRRIG 1000ML POUR BTL (IV SOLUTION) ×3 IMPLANT
PAD ARMBOARD 7.5X6 YLW CONV (MISCELLANEOUS) ×6 IMPLANT
PENCIL BUTTON HOLSTER BLD 10FT (ELECTRODE) ×3 IMPLANT
SCISSORS LAP 5X35 DISP (ENDOMECHANICALS) IMPLANT
SET IRRIG TUBING LAPAROSCOPIC (IRRIGATION / IRRIGATOR) ×2 IMPLANT
SHEARS HARMONIC ACE PLUS 36CM (ENDOMECHANICALS) ×2 IMPLANT
SLEEVE ENDOPATH XCEL 5M (ENDOMECHANICALS) ×7 IMPLANT
SUT MNCRL AB 4-0 PS2 18 (SUTURE) ×3 IMPLANT
SUT NOVA NAB DX-16 0-1 5-0 T12 (SUTURE) ×5 IMPLANT
TOWEL OR 17X24 6PK STRL BLUE (TOWEL DISPOSABLE) ×3 IMPLANT
TOWEL OR 17X26 10 PK STRL BLUE (TOWEL DISPOSABLE) ×3 IMPLANT
TRAY FOLEY CATH 16FR SILVER (SET/KITS/TRAYS/PACK) ×3 IMPLANT
TRAY FOLEY CATH SILVER 16FR (SET/KITS/TRAYS/PACK) ×2 IMPLANT
TRAY LAPAROSCOPIC MC (CUSTOM PROCEDURE TRAY) ×3 IMPLANT
TROCAR XCEL BLUNT TIP 100MML (ENDOMECHANICALS) IMPLANT
TROCAR XCEL NON-BLD 11X100MML (ENDOMECHANICALS) IMPLANT
TROCAR XCEL NON-BLD 5MMX100MML (ENDOMECHANICALS) ×3 IMPLANT
TUBING INSUFFLATION (TUBING) ×3 IMPLANT

## 2016-02-17 NOTE — Op Note (Signed)
02/17/2016  10:22 AM  PATIENT:  Lindsey Jenkins  75 y.o. female  PRE-OPERATIVE DIAGNOSIS:  Ventral hernia  POST-OPERATIVE DIAGNOSIS:  Ventral hernia  PROCEDURE:  Procedure(s): LAPAROSCOPIC VENTRAL HERNIA REPAIR WITH MESH (N/A) INSERTION OF MESH (N/A)  SURGEON:  Surgeon(s) and Role:    * Jovita Kussmaul, MD - Primary  PHYSICIAN ASSISTANT:   ASSISTANTS: Jamie Sipsis, RNFA   ANESTHESIA:   general  EBL:  No intake/output data recorded.  BLOOD ADMINISTERED:none  DRAINS: none   LOCAL MEDICATIONS USED:  MARCAINE     SPECIMEN:  No Specimen  DISPOSITION OF SPECIMEN:  N/A  COUNTS:  YES  TOURNIQUET:  * No tourniquets in log *  DICTATION: .Dragon Dictation   After informed consent was obtained the patient was brought to the operating room and placed in the supine position on the operating room table. After adequate induction of general anesthesia the patient's abdomen was prepped with ChloraPrep, allowed to dry, and draped in usual sterile manner. An appropriate timeout was performed.  The left upper quadrant area was infiltrated with quarter percent Marcaine. A small incision was made with a 15 blade knife. A 5 mm Optiview port with camera was used to bluntly dissect through the layers of the abdominal wall under direct vision until the abdominal cavity was entered. The abdomen was then insufflated with carbon dioxide without difficulty. The abdomen was generally inspected and the only abnormalities that were noted were 2 small hernia defects. One was at the umbilicus and the second was in the epigastric region. There were no adhesions to the abdominal wall. Two 34mm ports were placed on each side of the abdomen under direct vision. Both hernia defects were small but to completely cover both of them I chose a 15 x 20 cm piece of ventral light mesh. The mesh was oriented with the coated side towards the bowel. 6 stitches were placed around the edge of the mesh at equidistant points.  Next a  small incision was made through the epigastric hernia with a 15 blade knife. The incision was carried through the skin and subcutaneous tissue sharply with electrocautery until the hernia was entered. The hernia sac was excised sharply with the  Harmonic scalpel. The harmonic scalpel was also used to take down the falciform ligament during the intra-abdominal dissection. Next with the mesh properly oriented it was coated with saline and placed through the opening into the abdominal cavity. The fascial defect was then closed with interrupted #1 Novafil stitches. The abdomen was then insufflated again and the mesh was confirmed in the appropriate orientation. 6 small stab incisions were made at points that corresponded to the stitches. A suture passer was then placed through the abdominal wall and used to bring the tails of the stitches through each opening. Each of the stitches were then cinched down and tied. The mesh was observed to be in good apposition to the abdominal wall without any gaps or redundancy. Next a secure strap tacker was used to  Tack the mesh between the stitches to the abdominal wall so that there were no areas that bowel couldn't slip up over the mesh. Once this was accomplished the mesh was in good apposition to the abdominal wall.  The area was examined and found to be hemostatic. At this point the gas was allowed to escape and the ports were removed under direct vision.  The incisions were then closed with interrupted 4-0 Monocryl subcuticular stitches. Dermabond dressings were applied. The patient tolerated  the procedure well. At the end of the case all needle spongecounts were correct. The patient was then awakened and taken to recovery in stable condition.  PLAN OF CARE: Admit for overnight observation  PATIENT DISPOSITION:  PACU - hemodynamically stable.   Delay start of Pharmacological VTE agent (>24hrs) due to surgical blood loss or risk of bleeding: no

## 2016-02-17 NOTE — Transfer of Care (Signed)
Immediate Anesthesia Transfer of Care Note  Patient: Lindsey Jenkins  Procedure(s) Performed: Procedure(s): LAPAROSCOPIC VENTRAL HERNIA REPAIR WITH MESH (N/A) INSERTION OF MESH (N/A)  Patient Location: PACU  Anesthesia Type:General  Level of Consciousness: awake, alert , oriented and patient cooperative  Airway & Oxygen Therapy: Patient Spontanous Breathing and Patient connected to nasal cannula oxygen  Post-op Assessment: Report given to RN and Post -op Vital signs reviewed and stable  Post vital signs: Reviewed and stable  Last Vitals:  Vitals:   02/17/16 0639  BP: 117/75  Pulse: (!) 105  Resp: 20  Temp: 36.8 C    Last Pain:  Vitals:   02/17/16 0639  TempSrc: Oral         Complications: No apparent anesthesia complications

## 2016-02-17 NOTE — Interval H&P Note (Signed)
History and Physical Interval Note:  02/17/2016 8:18 AM  Lindsey Jenkins  has presented today for surgery, with the diagnosis of Ventral hernia  The various methods of treatment have been discussed with the patient and family. After consideration of risks, benefits and other options for treatment, the patient has consented to  Procedure(s): Ramsey (N/A) INSERTION OF MESH (N/A) as a surgical intervention .  The patient's history has been reviewed, patient examined, no change in status, stable for surgery.  I have reviewed the patient's chart and labs.  Questions were answered to the patient's satisfaction.     TOTH III,PAUL S

## 2016-02-17 NOTE — H&P (Signed)
Lindsey Jenkins  Location: [facility] Patient #: [id number] DOB: [birth year] Married / Language: English / Race: Black or African American Female   History of Present Illness The patient is a 75 year old female who presents for an evaluation of a hernia. We are asked to see the patient in consultation by Dr. Vania Rea to evaluate her for a ventral hernia. The patient is a 75 year old black female who has had a bulge along her upper abdomen for the last 4-5 years. Over the last several months it has become more noticeable and sore. She denies any nausea or vomiting. Her appetite is good and her bowels are working normally.   Other Problems Arthritis Back Pain Diabetes Mellitus High blood pressure Hypercholesterolemia Inguinal Hernia  Past Surgical History  Breast Biopsy Bilateral.  Diagnostic Studies History  Colonoscopy 5-10 years ago Mammogram within last year Pap Smear 1-5 years ago  Allergies  No Known Drug Allergies  Medication History  TraMADol HCl (50MG  Tablet, Oral) Active. Colestipol HCl (1GM Tablet, Oral) Active. Glimepiride (1MG  Tablet, Oral) Active. Januvia (100MG  Tablet, Oral) Active. Lisinopril (40MG  Tablet, Oral) Active. MetFORMIN HCl (500MG  Tablet, Oral) Active. Nifedical XL (30MG  Tablet ER 24HR, Oral) Active. Simvastatin (40MG  Tablet, Oral) Active. Medications Reconciled  Social History  Alcohol use Occasional alcohol use. Caffeine use Carbonated beverages, Tea. No drug use Tobacco use Never smoker.  Family History Arthritis Mother, Sister. Breast Cancer Sister. Cervical Cancer Sister. Diabetes Mellitus Brother, Mother, Sister. Hypertension Mother. Kidney Disease Brother.  Pregnancy / Birth History  Age at menarche 65 years. Age of menopause 52-60 Gravida 2 Maternal age 55-20 Para 2    Review of Systems  General Not Present- Appetite Loss, Chills, Fatigue, Fever, Night Sweats, Weight  Gain and Weight Loss. Skin Not Present- Change in Wart/Mole, Dryness, Hives, Jaundice, New Lesions, Non-Healing Wounds, Rash and Ulcer. HEENT Present- Wears glasses/contact lenses. Not Present- Earache, Hearing Loss, Hoarseness, Nose Bleed, Oral Ulcers, Ringing in the Ears, Seasonal Allergies, Sinus Pain, Sore Throat, Visual Disturbances and Yellow Eyes. Respiratory Present- Chronic Cough. Not Present- Bloody sputum, Difficulty Breathing, Snoring and Wheezing. Breast Not Present- Breast Mass, Breast Pain, Nipple Discharge and Skin Changes. Cardiovascular Present- Leg Cramps. Not Present- Chest Pain, Difficulty Breathing Lying Down, Palpitations, Rapid Heart Rate, Shortness of Breath and Swelling of Extremities. Gastrointestinal Present- Abdominal Pain. Not Present- Bloating, Bloody Stool, Change in Bowel Habits, Chronic diarrhea, Constipation, Difficulty Swallowing, Excessive gas, Gets full quickly at meals, Hemorrhoids, Indigestion, Nausea, Rectal Pain and Vomiting. Female Genitourinary Not Present- Frequency, Nocturia, Painful Urination, Pelvic Pain and Urgency. Musculoskeletal Present- Back Pain. Not Present- Joint Pain, Joint Stiffness, Muscle Pain, Muscle Weakness and Swelling of Extremities. Neurological Present- Tingling. Not Present- Decreased Memory, Fainting, Headaches, Numbness, Seizures, Tremor, Trouble walking and Weakness. Psychiatric Not Present- Anxiety, Bipolar, Change in Sleep Pattern, Depression, Fearful and Frequent crying. Endocrine Not Present- Cold Intolerance, Excessive Hunger, Hair Changes, Heat Intolerance, Hot flashes and New Diabetes. Hematology Not Present- Easy Bruising, Excessive bleeding, Gland problems, HIV and Persistent Infections.  Vitals Weight: 159 lb Height: 65in Body Surface Area: 1.79 m Body Mass Index: 26.46 kg/m  Temp.: 97.41F  Pulse: 100 (Regular)  BP: 128/82 (Sitting, Left Arm, Standard)       Physical Exam  General Mental  Status-Alert. General Appearance-Consistent with stated age. Hydration-Well hydrated. Voice-Normal.  Head and Neck Head-normocephalic, atraumatic with no lesions or palpable masses. Trachea-midline. Thyroid Gland Characteristics - normal size and consistency.  Eye Eyeball - Bilateral-Extraocular movements intact. Sclera/Conjunctiva -  Bilateral-No scleral icterus.  Chest and Lung Exam Chest and lung exam reveals -quiet, even and easy respiratory effort with no use of accessory muscles and on auscultation, normal breath sounds, no adventitious sounds and normal vocal resonance. Inspection Chest Wall - Normal. Back - normal.  Cardiovascular Cardiovascular examination reveals -normal heart sounds, regular rate and rhythm with no murmurs and normal pedal pulses bilaterally.  Abdomen Note: The abdomen is soft and nontender. There appears to be a rectus diastases along the upper abdomen. In the middle of the rectus diastases there seems to be an additional reducible bulge that pops out   Neurologic Neurologic evaluation reveals -alert and oriented x 3 with no impairment of recent or remote memory. Mental Status-Normal.  Musculoskeletal Normal Exam - Left-Upper Extremity Strength Normal and Lower Extremity Strength Normal. Normal Exam - Right-Upper Extremity Strength Normal and Lower Extremity Strength Normal.  Lymphatic Head & Neck  General Head & Neck Lymphatics: Bilateral - Description - Normal. Axillary  General Axillary Region: Bilateral - Description - Normal. Tenderness - Non Tender. Femoral & Inguinal  Generalized Femoral & Inguinal Lymphatics: Bilateral - Description - Normal. Tenderness - Non Tender.    Assessment & Plan  SUPRAUMBILICAL HERNIA ([id number]) Impression: The patient appears to have a small reducible supraumbilical hernia with no history of previous surgery. Because of the risk of incarceration and strangulation I think she  would benefit from having the hernia fixed. She would also like to have this done. I have discussed with her in detail the risks and benefits of the operation to fix the hernia as well as some of the technical aspects and she understands and wishes to proceed. I will plan for a laparoscopic ventral hernia repair with mesh

## 2016-02-17 NOTE — Anesthesia Postprocedure Evaluation (Signed)
Anesthesia Post Note  Patient: Lindsey Jenkins  Procedure(s) Performed: Procedure(s) (LRB): LAPAROSCOPIC VENTRAL HERNIA REPAIR WITH MESH (N/A) INSERTION OF MESH (N/A)  Patient location during evaluation: PACU Anesthesia Type: General Level of consciousness: awake Pain management: pain level controlled Vital Signs Assessment: post-procedure vital signs reviewed and stable Respiratory status: spontaneous breathing Cardiovascular status: stable Anesthetic complications: no    Last Vitals:  Vitals:   02/17/16 1225 02/17/16 1253  BP:  (!) 147/78  Pulse:  92  Resp:    Temp: 36.4 C 36.9 C    Last Pain:  Vitals:   02/17/16 1425  TempSrc:   PainSc: 4                  EDWARDS,Braylinn Gulden

## 2016-02-17 NOTE — Anesthesia Preprocedure Evaluation (Signed)
Anesthesia Evaluation  Patient identified by MRN, date of birth, ID band Patient awake    Reviewed: Allergy & Precautions, NPO status , reviewed documented beta blocker date and time   Airway Mallampati: II       Dental   Pulmonary neg pulmonary ROS,    Pulmonary exam normal        Cardiovascular negative cardio ROS   Rhythm:Regular Rate:Normal     Neuro/Psych    GI/Hepatic Neg liver ROS, GERD  ,  Endo/Other  diabetes  Renal/GU negative Renal ROS     Musculoskeletal   Abdominal   Peds  Hematology   Anesthesia Other Findings   Reproductive/Obstetrics                             Anesthesia Physical Anesthesia Plan  ASA: III  Anesthesia Plan: General   Post-op Pain Management:    Induction: Intravenous  Airway Management Planned: Oral ETT  Additional Equipment:   Intra-op Plan:   Post-operative Plan: Extubation in OR  Informed Consent: I have reviewed the patients History and Physical, chart, labs and discussed the procedure including the risks, benefits and alternatives for the proposed anesthesia with the patient or authorized representative who has indicated his/her understanding and acceptance.   Dental advisory given  Plan Discussed with: CRNA and Anesthesiologist  Anesthesia Plan Comments:         Anesthesia Quick Evaluation

## 2016-02-18 ENCOUNTER — Encounter (HOSPITAL_COMMUNITY): Payer: Self-pay | Admitting: General Surgery

## 2016-02-18 LAB — GLUCOSE, CAPILLARY
GLUCOSE-CAPILLARY: 172 mg/dL — AB (ref 65–99)
Glucose-Capillary: 147 mg/dL — ABNORMAL HIGH (ref 65–99)
Glucose-Capillary: 140 mg/dL — ABNORMAL HIGH (ref 65–99)
Glucose-Capillary: 150 mg/dL — ABNORMAL HIGH (ref 65–99)

## 2016-02-18 MED ORDER — MENTHOL 3 MG MT LOZG
1.0000 | LOZENGE | OROMUCOSAL | Status: DC | PRN
  Administered 2016-02-18: 3 mg via ORAL
  Filled 2016-02-18 (×2): qty 9

## 2016-02-18 NOTE — Progress Notes (Signed)
1 Day Post-Op  Subjective: Still having a lot of pain requiring IV pain meds  Objective: Vital signs in last 24 hours: Temp:  [97.5 F (36.4 C)-98.9 F (37.2 C)] 98.3 F (36.8 C) (07/27 0658) Pulse Rate:  [77-106] 99 (07/27 0658) Resp:  [11-18] 18 (07/27 0658) BP: (121-147)/(53-78) 147/75 (07/27 0658) SpO2:  [92 %-100 %] 94 % (07/27 0658) Last BM Date: 02/16/16  Intake/Output from previous day: 07/26 0701 - 07/27 0700 In: 1863.3 [P.O.:25; I.V.:1838.3] Out: 1800 [Urine:1775; Blood:25] Intake/Output this shift: No intake/output data recorded.  Resp: clear to auscultation bilaterally Cardio: regular rate and rhythm GI: soft, tender at incisions. good bs. incisions look good  Lab Results:  No results for input(s): WBC, HGB, HCT, PLT in the last 72 hours. BMET No results for input(s): NA, K, CL, CO2, GLUCOSE, BUN, CREATININE, CALCIUM in the last 72 hours. PT/INR No results for input(s): LABPROT, INR in the last 72 hours. ABG No results for input(s): PHART, HCO3 in the last 72 hours.  Invalid input(s): PCO2, PO2  Studies/Results: No results found.  Anti-infectives: Anti-infectives    Start     Dose/Rate Route Frequency Ordered Stop   02/17/16 0800  ceFAZolin (ANCEF) IVPB 2g/100 mL premix     2 g 200 mL/hr over 30 Minutes Intravenous To [facility] 02/16/16 1059 02/17/16 0829      Assessment/Plan: s/p Procedure(s): LAPAROSCOPIC VENTRAL HERNIA REPAIR WITH MESH (N/A) INSERTION OF MESH (N/A) Advance diet  Continue to work on pain control Not ready for discharge yet ambulate  LOS: 0 days    TOTH III,Eliska Hamil S 02/18/2016

## 2016-02-18 NOTE — Care Management Obs Status (Signed)
[place] NOTIFICATION   Patient Details  Name: Lindsey Jenkins MRN: [id number] Date of Birth: 1941-07-11   Medicare Observation Status Notification Given:  Yes    Marilu Favre, RN 02/18/2016, 11:47 AM

## 2016-02-19 LAB — GLUCOSE, CAPILLARY
GLUCOSE-CAPILLARY: 140 mg/dL — AB (ref 65–99)
GLUCOSE-CAPILLARY: 130 mg/dL — AB (ref 65–99)
GLUCOSE-CAPILLARY: 117 mg/dL — AB (ref 65–99)
Glucose-Capillary: 124 mg/dL — ABNORMAL HIGH (ref 65–99)

## 2016-02-19 NOTE — Progress Notes (Signed)
2 Days Post-Op  Subjective: Still complaining of pain. Hasn't passed any flatus yet and has had some nausea  Objective: Vital signs in last 24 hours: Temp:  [98.4 F (36.9 C)-100.1 F (37.8 C)] 99.2 F (37.3 C) (07/28 0500) Pulse Rate:  [93-121] 121 (07/28 0500) BP: (105-153)/(70-81) 134/72 (07/28 0500) SpO2:  [93 %-95 %] 94 % (07/28 0500) Last BM Date: 02/17/16  Intake/Output from previous day: 07/27 0701 - 07/28 0700 In: 550 [I.V.:550] Out: 2000 [Urine:2000] Intake/Output this shift: No intake/output data recorded.  Resp: clear to auscultation bilaterally Cardio: regular rate and rhythm GI: soft, moderate tenderness at incisions. incisions look good. good bs  Lab Results:  No results for input(s): WBC, HGB, HCT, PLT in the last 72 hours. BMET No results for input(s): NA, K, CL, CO2, GLUCOSE, BUN, CREATININE, CALCIUM in the last 72 hours. PT/INR No results for input(s): LABPROT, INR in the last 72 hours. ABG No results for input(s): PHART, HCO3 in the last 72 hours.  Invalid input(s): PCO2, PO2  Studies/Results: No results found.  Anti-infectives: Anti-infectives    Start     Dose/Rate Route Frequency Ordered Stop   02/17/16 0800  ceFAZolin (ANCEF) IVPB 2g/100 mL premix     2 g 200 mL/hr over 30 Minutes Intravenous To [facility]Stay Surgical 02/16/16 1059 02/17/16 0829      Assessment/Plan: s/p Procedure(s): LAPAROSCOPIC VENTRAL HERNIA REPAIR WITH MESH (N/A) INSERTION OF MESH (N/A)  POD 2 Advance diet  Continue to work on pain control Slight ileus. Waiting for return of bowel function Hopefully home over the weekend  LOS: 0 days    TOTH III,Hildred Mollica S 02/19/2016

## 2016-02-20 LAB — GLUCOSE, CAPILLARY
GLUCOSE-CAPILLARY: 112 mg/dL — AB (ref 65–99)
GLUCOSE-CAPILLARY: 146 mg/dL — AB (ref 65–99)
Glucose-Capillary: 136 mg/dL — ABNORMAL HIGH (ref 65–99)
Glucose-Capillary: 128 mg/dL — ABNORMAL HIGH (ref 65–99)

## 2016-02-20 NOTE — Progress Notes (Signed)
Patient ID: Lindsey Jenkins, female   DOB: 1940/08/01, 75 y.o.   MRN: [id number] [facility] Progress Note:   3 Days Post-Op  Subjective: Mental status is alert;  Feels bloated and no flatus;  Nauseated with clear liquids Objective: Vital signs in last 24 hours: Temp:  [98.4 F (36.9 C)-99.6 F (37.6 C)] 98.4 F (36.9 C) (07/29 0508) Pulse Rate:  [101-114] 110 (07/29 0508) Resp:  [16-18] 18 (07/29 0508) BP: (125-136)/(69-80) 125/78 (07/29 0508) SpO2:  [92 %-96 %] 93 % (07/29 0508)  Intake/Output from previous day: 07/28 0701 - 07/29 0700 In: 1060 [P.O.:1060] Out: 1150 [Urine:1150] Intake/Output this shift: No intake/output data recorded.  Physical Exam: Work of breathing is normal.  Abdomen is a little sore in the LLQ.  She is wearing abdominal binder.  No flatus  Lab Results:  Results for orders placed or performed during the hospital encounter of 02/17/16 (from the past 48 hour(s))  Glucose, capillary     Status: Abnormal   Collection Time: 02/18/16  5:02 PM  Result Value Ref Range   Glucose-Capillary 140 (H) 65 - 99 mg/dL  Glucose, capillary     Status: Abnormal   Collection Time: 02/18/16  9:54 PM  Result Value Ref Range   Glucose-Capillary 150 (H) 65 - 99 mg/dL  Glucose, capillary     Status: Abnormal   Collection Time: 02/19/16  7:38 AM  Result Value Ref Range   Glucose-Capillary 140 (H) 65 - 99 mg/dL  Glucose, capillary     Status: Abnormal   Collection Time: 02/19/16 12:03 PM  Result Value Ref Range   Glucose-Capillary 130 (H) 65 - 99 mg/dL  Glucose, capillary     Status: Abnormal   Collection Time: 02/19/16  5:13 PM  Result Value Ref Range   Glucose-Capillary 117 (H) 65 - 99 mg/dL  Glucose, capillary     Status: Abnormal   Collection Time: 02/19/16  9:56 PM  Result Value Ref Range   Glucose-Capillary 124 (H) 65 - 99 mg/dL  Glucose, capillary     Status: Abnormal   Collection Time: 02/20/16  7:27 AM  Result Value Ref Range   Glucose-Capillary 136  (H) 65 - 99 mg/dL   Comment 1 Notify RN   Glucose, capillary     Status: Abnormal   Collection Time: 02/20/16 11:50 AM  Result Value Ref Range   Glucose-Capillary 128 (H) 65 - 99 mg/dL    Radiology/Results: No results found.  Anti-infectives: Anti-infectives    Start     Dose/Rate Route Frequency Ordered Stop   02/17/16 0800  ceFAZolin (ANCEF) IVPB 2g/100 mL premix     2 g 200 mL/hr over 30 Minutes Intravenous To [facility] 02/16/16 1059 02/17/16 0829      Assessment/Plan: Problem List: Patient Active Problem List   Diagnosis Date Noted  . Ventral hernia without obstruction or gangrene 02/17/2016  . Multinodular goiter 06/22/2015  . Encounter for long-term (current) use of other medications 05/30/2013  . 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 Left wrist pain 08/29/2011  . Neck pain 05/29/2011  . Cellulitis of leg, left 02/16/2011  . HIP PAIN 05/24/2010  . BACK PAIN, LUMBAR 05/21/2009  . 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 GASTROESOPHAGEAL REFLUX DISEASE 08/21/2007  . Diabetes ([place]) 04/19/2007  . Dyslipidemia 04/19/2007  . HTN (hypertension) 04/19/2007  . MENOPAUSAL SYNDROME 04/19/2007  . Osteoporosis 04/19/2007  . DIVERTICULOSIS, COLON 07/20/2006    Post lap ventral hernia repair.  Ileus precluding advancement of diet. 3  Days Post-Op    LOS: 0 days   Matt B. Hassell Done, MD, [facility] [phone] beeper [phone]  02/20/2016 12:24 PM

## 2016-02-21 LAB — GLUCOSE, CAPILLARY
GLUCOSE-CAPILLARY: 155 mg/dL — AB (ref 65–99)
GLUCOSE-CAPILLARY: 162 mg/dL — AB (ref 65–99)
Glucose-Capillary: 135 mg/dL — ABNORMAL HIGH (ref 65–99)
Glucose-Capillary: 186 mg/dL — ABNORMAL HIGH (ref 65–99)

## 2016-02-21 NOTE — Progress Notes (Signed)
4 Days Post-Op  Subjective: Nauseated, small emesis  Objective: Vital signs in last 24 hours: Temp:  [97.7 F (36.5 C)-99.1 F (37.3 C)] 97.7 F (36.5 C) (07/30 0634) Pulse Rate:  [115-119] 116 (07/30 0634) Resp:  [17] 17 (07/30 0634) BP: (126-137)/(64-87) 128/64 (07/30 0634) SpO2:  [93 %-96 %] 96 % (07/30 0634) Last BM Date:  (PTA)  Intake/Output from previous day: No intake/output data recorded. Intake/Output this shift: No intake/output data recorded.  General appearance: cooperative Resp: clear to auscultation bilaterally GI: distended but soft, incisions OK  Lab Results:  No results for input(s): WBC, HGB, HCT, PLT in the last 72 hours. BMET No results for input(s): NA, K, CL, CO2, GLUCOSE, BUN, CREATININE, CALCIUM in the last 72 hours. PT/INR No results for input(s): LABPROT, INR in the last 72 hours. ABG No results for input(s): PHART, HCO3 in the last 72 hours.  Invalid input(s): PCO2, PO2  Studies/Results: No results found.  Anti-infectives: Anti-infectives    Start     Dose/Rate Route Frequency Ordered Stop   02/17/16 0800  ceFAZolin (ANCEF) IVPB 2g/100 mL premix     2 g 200 mL/hr over 30 Minutes Intravenous To [facility]Stay Surgical 02/16/16 1059 02/17/16 0829      Assessment/Plan: s/p Procedure(s): LAPAROSCOPIC VENTRAL HERNIA REPAIR WITH MESH (N/A) INSERTION OF MESH (N/A) POD#4 Ileus - back down to clears, continue IVF, labs in AM, encouraged ambulation VTE - SQ hep   LOS: 0 days    Torion Hulgan E 02/21/2016

## 2016-02-22 DIAGNOSIS — Y95 Nosocomial condition: Secondary | ICD-10-CM | POA: Diagnosis not present

## 2016-02-22 DIAGNOSIS — R140 Abdominal distension (gaseous): Secondary | ICD-10-CM | POA: Diagnosis not present

## 2016-02-22 DIAGNOSIS — I10 Essential (primary) hypertension: Secondary | ICD-10-CM | POA: Diagnosis present

## 2016-02-22 DIAGNOSIS — J9601 Acute respiratory failure with hypoxia: Secondary | ICD-10-CM | POA: Diagnosis not present

## 2016-02-22 DIAGNOSIS — E8770 Fluid overload, unspecified: Secondary | ICD-10-CM | POA: Diagnosis not present

## 2016-02-22 DIAGNOSIS — R000 Tachycardia, unspecified: Secondary | ICD-10-CM | POA: Diagnosis present

## 2016-02-22 DIAGNOSIS — E46 Unspecified protein-calorie malnutrition: Secondary | ICD-10-CM | POA: Diagnosis not present

## 2016-02-22 DIAGNOSIS — E875 Hyperkalemia: Secondary | ICD-10-CM | POA: Diagnosis not present

## 2016-02-22 DIAGNOSIS — K567 Ileus, unspecified: Secondary | ICD-10-CM | POA: Diagnosis present

## 2016-02-22 DIAGNOSIS — E119 Type 2 diabetes mellitus without complications: Secondary | ICD-10-CM | POA: Diagnosis present

## 2016-02-22 DIAGNOSIS — I493 Ventricular premature depolarization: Secondary | ICD-10-CM | POA: Diagnosis not present

## 2016-02-22 DIAGNOSIS — Z79899 Other long term (current) drug therapy: Secondary | ICD-10-CM | POA: Diagnosis not present

## 2016-02-22 DIAGNOSIS — E785 Hyperlipidemia, unspecified: Secondary | ICD-10-CM | POA: Diagnosis present

## 2016-02-22 DIAGNOSIS — F502 Bulimia nervosa: Secondary | ICD-10-CM | POA: Diagnosis not present

## 2016-02-22 DIAGNOSIS — K5669 Other intestinal obstruction: Secondary | ICD-10-CM | POA: Diagnosis not present

## 2016-02-22 DIAGNOSIS — K219 Gastro-esophageal reflux disease without esophagitis: Secondary | ICD-10-CM | POA: Diagnosis present

## 2016-02-22 DIAGNOSIS — N179 Acute kidney failure, unspecified: Secondary | ICD-10-CM | POA: Diagnosis not present

## 2016-02-22 DIAGNOSIS — K439 Ventral hernia without obstruction or gangrene: Secondary | ICD-10-CM | POA: Diagnosis present

## 2016-02-22 DIAGNOSIS — J189 Pneumonia, unspecified organism: Secondary | ICD-10-CM | POA: Diagnosis not present

## 2016-02-22 DIAGNOSIS — Z7984 Long term (current) use of oral hypoglycemic drugs: Secondary | ICD-10-CM | POA: Diagnosis not present

## 2016-02-22 DIAGNOSIS — M1990 Unspecified osteoarthritis, unspecified site: Secondary | ICD-10-CM | POA: Diagnosis present

## 2016-02-22 LAB — CBC
HCT: 40.6 % (ref 36.0–46.0)
Hemoglobin: 13.3 g/dL (ref 12.0–15.0)
MCH: 25.8 pg — ABNORMAL LOW (ref 26.0–34.0)
MCHC: 32.8 g/dL (ref 30.0–36.0)
MCV: 78.8 fL (ref 78.0–100.0)
PLATELETS: 273 10*3/uL (ref 150–400)
RBC: 5.15 MIL/uL — AB (ref 3.87–5.11)
RDW: 14.3 % (ref 11.5–15.5)
WBC: 4.8 10*3/uL (ref 4.0–10.5)

## 2016-02-22 LAB — BASIC METABOLIC PANEL
Anion gap: 6 (ref 5–15)
BUN: 20 mg/dL (ref 6–20)
CHLORIDE: 106 mmol/L (ref 101–111)
CO2: 24 mmol/L (ref 22–32)
CREATININE: 1.05 mg/dL — AB (ref 0.44–1.00)
Calcium: 9.5 mg/dL (ref 8.9–10.3)
GFR, EST AFRICAN AMERICAN: 60 mL/min — AB (ref >60)
GFR, EST NON AFRICAN AMERICAN: 52 mL/min — AB (ref >60)
Glucose, Bld: 167 mg/dL — ABNORMAL HIGH (ref 65–99)
POTASSIUM: 5.3 mmol/L — AB (ref 3.5–5.1)
SODIUM: 136 mmol/L (ref 135–145)

## 2016-02-22 LAB — GLUCOSE, CAPILLARY
GLUCOSE-CAPILLARY: 175 mg/dL — AB (ref 65–99)
Glucose-Capillary: 170 mg/dL — ABNORMAL HIGH (ref 65–99)
Glucose-Capillary: 138 mg/dL — ABNORMAL HIGH (ref 65–99)
Glucose-Capillary: 144 mg/dL — ABNORMAL HIGH (ref 65–99)

## 2016-02-22 MED ORDER — SODIUM CHLORIDE 0.9 % IV SOLN
INTRAVENOUS | Status: DC
  Administered 2016-02-22 – 2016-02-26 (×7): via INTRAVENOUS

## 2016-02-22 MED ORDER — SODIUM CHLORIDE 0.45 % IV SOLN: INTRAVENOUS | Status: DC

## 2016-02-22 MED ORDER — BISACODYL 10 MG RE SUPP
10.0000 mg | Freq: Once | RECTAL | Status: AC
  Administered 2016-02-22: 10 mg via RECTAL
  Filled 2016-02-22: qty 1

## 2016-02-22 NOTE — Progress Notes (Signed)
5 Days Post-Op  Subjective: Sore. 3 episodes of flatus but had several episodes of emesis. No bm. Didn't walk in halls  Objective: Vital signs in last 24 hours: Temp:  [97.8 F (36.6 C)-99.2 F (37.3 C)] 98.4 F (36.9 C) (07/31 0701) Pulse Rate:  [118-125] 125 (07/31 0701) Resp:  [18] 18 (07/31 0701) BP: (119-143)/(63-78) 143/73 (07/31 0701) SpO2:  [94 %-95 %] 95 % (07/31 0701) Weight:  [72.6 kg (160 lb)] 72.6 kg (160 lb) (07/30 1800) Last BM Date: 02/17/16 (pta)  Intake/Output from previous day: 07/30 0701 - 07/31 0700 In: 2965.8 [P.O.:120; I.V.:2845.8] Out: 850 [Urine:850] Intake/Output this shift: No intake/output data recorded.  Alert, nad cta b/l Mild tachy.  Distended, soft, hypoBS, mild expected TTP. Incisions c/d/i No edema  Lab Results:   Recent Labs  02/22/16 0435  WBC 4.8  HGB 13.3  HCT 40.6  PLT 273   BMET  Recent Labs  02/22/16 0435  NA 136  K 5.3*  CL 106  CO2 24  GLUCOSE 167*  BUN 20  CREATININE 1.05*  CALCIUM 9.5   PT/INR No results for input(s): LABPROT, INR in the last 72 hours. ABG No results for input(s): PHART, HCO3 in the last 72 hours.  Invalid input(s): PCO2, PO2  Studies/Results: No results found.  Anti-infectives: Anti-infectives    Start     Dose/Rate Route Frequency Ordered Stop   02/17/16 0800  ceFAZolin (ANCEF) IVPB 2g/100 mL premix     2 g 200 mL/hr over 30 Minutes Intravenous To [facility]Stay Surgical 02/16/16 1059 02/17/16 0829      Assessment/Plan: s/p Procedure(s): LAPAROSCOPIC VENTRAL HERNIA REPAIR WITH MESH (N/A) INSERTION OF MESH (N/A)  Ileus - keep on clears. If has ongoing emesis today, will check films and possible NG Mild hyperkalemia - remove K from fluids F/E/N- cont ivf; clears as tolerated Tachycardia - has had persistent mild tachycardia. No fever. No wbc. No signif TTP. No cp. No sob. Will monitor for now Endo - bld sugars ok Cont chemical vte prophylaxis  [name]. [name], MD,  FACS General, Bariatric, & Minimally Invasive Surgery [facility], [place]   LOS: 0 days    Gayland Curry 02/22/2016

## 2016-02-22 NOTE — Evaluation (Signed)
Physical Therapy Evaluation Patient Details Name: Lindsey Jenkins MRN: [id number] DOB: [birth year] Today's Date: 02/22/2016   History of Present Illness  Pt is a 75 y/o female s/p ventral hernai repair. PMH including but not limited to DM and HTN.  Clinical Impression  Pt presented sitting upright in recliner when PT entered room. Pt stated that she was "doing fine until I threw up yesterday". Pt's nurse tech reported that pt just transferred from her bed to the recliner prior to PT entering room, and that she was moving well. Pt performed all functional mobility with min guard for safety only, no physical assistance needed. Pt stated that prior to admission, she was independent with all functional mobility; however, she requested to use RW when ambulating within and outside of her room during session. Pt stated that she was nervous of the pain; however, denied any feelings of weakness or unsteadiness throughout session. Pt would continue to benefit from skilled physical therapy services at this time while admitted to address her below listed limitations in order to improve her overall safety and independence with functional mobility.      Follow Up Recommendations Supervision for mobility/OOB    Equipment Recommendations  None recommended by PT    Recommendations for Other Services       Precautions / Restrictions Precautions Precautions: None Restrictions Weight Bearing Restrictions: No      Mobility  Bed Mobility               General bed mobility comments: Pt sitting OOB in recliner upon PT's arrival.  Transfers Overall transfer level: Needs assistance Equipment used: Rolling walker (2 wheeled) Transfers: Sit to/from Stand Sit to Stand: Min guard         General transfer comment: Pt required increased time and VC'ing for bilateral hand positioning and to keep RW close to her until she was positioned in front of chair.  Ambulation/Gait Ambulation/Gait assistance: Min  guard Ambulation Distance (Feet): 300 Feet Assistive device: Rolling walker (2 wheeled) Gait Pattern/deviations: Step-through pattern Gait velocity: decreased Gait velocity interpretation: Below normal speed for age/gender General Gait Details: Pt required VC'ing to maintain body within frame of RW for safety.  Stairs            Wheelchair Mobility    Modified Rankin (Stroke Patients Only)       Balance Overall balance assessment: Needs assistance Sitting-balance support: No upper extremity supported;Feet supported Sitting balance-Leahy Scale: Fair     Standing balance support: No upper extremity supported;During functional activity Standing balance-Leahy Scale: Fair                               Pertinent Vitals/Pain Pain Assessment: No/denies pain    Home Living Family/patient expects to be discharged to:: Private residence Living Arrangements: Spouse/significant other;Children;Other relatives Available Help at Discharge: Family;Available 24 hours/day Type of Home: House Home Access: Stairs to enter Entrance Stairs-Rails: Can reach both Entrance Stairs-Number of Steps: 3 Home Layout: Two level;Able to live on main level with bedroom/bathroom Home Equipment: Gilford Rile - 2 wheels;Shower seat      Prior Function Level of Independence: Independent               Hand Dominance        Extremity/Trunk Assessment   Upper Extremity Assessment: Overall WFL for tasks assessed           Lower Extremity Assessment: Overall WFL for tasks assessed  Communication   Communication: No difficulties  Cognition Arousal/Alertness: Awake/alert Behavior During Therapy: WFL for tasks assessed/performed Overall Cognitive Status: Within Functional Limits for tasks assessed                      General Comments General comments (skin integrity, edema, etc.): abdominal binder donned throughout session    Exercises         Assessment/Plan    PT Assessment Patient needs continued PT services  PT Diagnosis Difficulty walking;Acute pain   PT Problem List Decreased strength;Decreased activity tolerance;Decreased balance;Decreased mobility;Decreased knowledge of use of DME;Pain  PT Treatment Interventions DME instruction;Gait training;Stair training;Functional mobility training;Therapeutic activities;Therapeutic exercise;Balance training;Patient/family education   PT Goals (Current goals can be found in the Care Plan section) Acute Rehab PT Goals Patient Stated Goal: return home PT Goal Formulation: With patient Time For Goal Achievement: 02/29/16 Potential to Achieve Goals: Good    Frequency Min 3X/week   Barriers to discharge        Co-evaluation               End of Session   Activity Tolerance: Patient limited by pain Patient left: in chair;with call bell/phone within reach Nurse Communication: Mobility status         Time: [id number] PT Time Calculation (min) (ACUTE ONLY): 18 min   Charges:   PT Evaluation $PT Eval Moderate Complexity: 1 Procedure     PT G CodesClearnce Sorrel Alonah Lineback 02/22/2016, 12:12 PM Sherie Don, Bowles, DPT [phone]

## 2016-02-23 ENCOUNTER — Encounter (HOSPITAL_COMMUNITY): Payer: Self-pay | Admitting: Physician Assistant

## 2016-02-23 DIAGNOSIS — I10 Essential (primary) hypertension: Secondary | ICD-10-CM

## 2016-02-23 DIAGNOSIS — K439 Ventral hernia without obstruction or gangrene: Secondary | ICD-10-CM

## 2016-02-23 DIAGNOSIS — R000 Tachycardia, unspecified: Secondary | ICD-10-CM

## 2016-02-23 LAB — BASIC METABOLIC PANEL
Anion gap: 8 (ref 5–15)
BUN: 13 mg/dL (ref 6–20)
CALCIUM: 9.1 mg/dL (ref 8.9–10.3)
CO2: 21 mmol/L — AB (ref 22–32)
CREATININE: 0.84 mg/dL (ref 0.44–1.00)
Chloride: 108 mmol/L (ref 101–111)
GFR calc Af Amer: >60 mL/min (ref >60)
GLUCOSE: 157 mg/dL — AB (ref 65–99)
Potassium: 4.5 mmol/L (ref 3.5–5.1)
Sodium: 137 mmol/L (ref 135–145)

## 2016-02-23 LAB — GLUCOSE, CAPILLARY
GLUCOSE-CAPILLARY: 106 mg/dL — AB (ref 65–99)
GLUCOSE-CAPILLARY: 151 mg/dL — AB (ref 65–99)
Glucose-Capillary: 119 mg/dL — ABNORMAL HIGH (ref 65–99)
Glucose-Capillary: 171 mg/dL — ABNORMAL HIGH (ref 65–99)

## 2016-02-23 MED ORDER — METOPROLOL TARTRATE 12.5 MG HALF TABLET
12.5000 mg | ORAL_TABLET | Freq: Two times a day (BID) | ORAL | Status: DC
  Administered 2016-02-23: 12.5 mg via ORAL
  Filled 2016-02-23: qty 1

## 2016-02-23 MED ORDER — ENSURE ENLIVE PO LIQD
237.0000 mL | Freq: Two times a day (BID) | ORAL | Status: DC
  Administered 2016-02-23 – 2016-02-24 (×2): 237 mL via ORAL

## 2016-02-23 NOTE — Evaluation (Signed)
Occupational Therapy Evaluation Patient Details Name: Lindsey Jenkins MRN: [id number] DOB: 1940-10-11 Today's Date: 02/23/2016    History of Present Illness Pt is a 75 y/o female s/p ventral hernis repair. Hospital course complicated by ileus. PMH including but not limited to DM and HTN.   Clinical Impression   Pt was independent prior to admission. Presents with abdominal pain and generalized weakness. Pain prevents pt from being able to perform LB bathing and dressing. Pt moving well, but slowly. Encouraged log roll technique for bed mobility Will follow. Do not anticipate pt will need post acute OT.    Follow Up Recommendations  No OT follow up    Equipment Recommendations   (will determine need for 3 in 1)    Recommendations for Other Services       Precautions / Restrictions Precautions Precautions: Fall Precaution Comments: abdominal binder Restrictions Weight Bearing Restrictions: No      Mobility Bed Mobility Overal bed mobility: Needs Assistance Bed Mobility: Rolling;Sidelying to Sit;Sit to Sidelying Rolling: Supervision Sidelying to sit: Supervision     Sit to sidelying: Supervision General bed mobility comments: instructed in log roll technique to minimize pain  Transfers Overall transfer level: Needs assistance Equipment used: Rolling walker (2 wheeled)   Sit to Stand: Supervision;Min guard         General transfer comment: min guard from bed, supervision from toilet, cues for hand placement and technique with walker, increased time due to pain    Balance                                            ADL Overall ADL's : Needs assistance/impaired Eating/Feeding: Independent;Sitting   Grooming: Wash/dry hands;Supervision/safety;Standing   Upper Body Bathing: Set up;Sitting   Lower Body Bathing: Sit to/from stand;Minimal assistance   Upper Body Dressing : Set up;Sitting   Lower Body Dressing: Sit to/from stand;Minimal  assistance   Toilet Transfer: Supervision/safety;Ambulation;RW   Toileting- Clothing Manipulation and Hygiene: Supervision/safety;Sit to/from stand       Functional mobility during ADLs: Min guard;Rolling walker General ADL Comments: pt not able to cross her foot over opposite knee to reach feet for ADL due to increased pain this session, pt is typically able however     Vision     Perception     Praxis      Pertinent Vitals/Pain Pain Assessment: 0-10 Pain Score: 5  Pain Location: abdomen Pain Descriptors / Indicators: Aching;Guarding;Grimacing Pain Intervention(s): Monitored during session;Repositioned     Hand Dominance Right   Extremity/Trunk Assessment Upper Extremity Assessment Upper Extremity Assessment: Overall WFL for tasks assessed   Lower Extremity Assessment Lower Extremity Assessment: Overall WFL for tasks assessed       Communication Communication Communication: No difficulties   Cognition Arousal/Alertness: Awake/alert Behavior During Therapy: WFL for tasks assessed/performed Overall Cognitive Status: Within Functional Limits for tasks assessed                     General Comments       Exercises       Shoulder Instructions      Home Living Family/patient expects to be discharged to:: Private residence Living Arrangements: Spouse/significant other;Children;Other relatives Available Help at Discharge: Family;Available 24 hours/day Type of Home: House Home Access: Stairs to enter [employer] of Steps: 3 Entrance Stairs-Rails: Can reach both Home Layout: Two level;Able to live  on main level with bedroom/bathroom Alternate Level Stairs-Number of Steps: full flight Alternate Level Stairs-Rails: Can reach both Bathroom Shower/Tub: Teacher, early years/pre: Standard     Home Equipment: Environmental consultant - 2 wheels;Shower seat   Additional Comments: does not typically use shower seat      Prior Functioning/Environment  Level of Independence: Independent             OT Diagnosis: Generalized weakness;Acute pain   OT Problem List: Decreased activity tolerance;Impaired balance (sitting and/or standing);Decreased knowledge of use of DME or AE;Pain   OT Treatment/Interventions: Self-care/ADL training;DME and/or AE instruction;Patient/family education    OT Goals(Current goals can be found in the care plan section) Acute Rehab OT Goals Patient Stated Goal: return home OT Goal Formulation: With patient Time For Goal Achievement: 03/01/16 Potential to Achieve Goals: Good  OT Frequency: Min 2X/week   Barriers to D/C:            Co-evaluation              End of Session Nurse Communication:  (pt called RN for pain meds)  Activity Tolerance: Patient limited by pain Patient left: in bed;with call bell/phone within reach   Time: 0925-0939 OT Time Calculation (min): 14 min Charges:  OT General Charges $OT Visit: 1 Procedure OT Evaluation $OT Eval Low Complexity: 1 Procedure G-Codes:    Malka So 02/23/2016, 9:45 AM  [phone]

## 2016-02-23 NOTE — Progress Notes (Signed)
Physical Therapy Treatment Patient Details Name: Lindsey Jenkins MRN: [id number] DOB: 1940/10/19 Today's Date: 02/23/2016    History of Present Illness Pt is a 75 y/o female s/p ventral hernis repair. Hospital course complicated by ileus. PMH including but not limited to DM and HTN.    PT Comments    Pt performed increased gait and successful trial of stair training.  Attempt gait next session without AD.    Follow Up Recommendations  Supervision for mobility/OOB     Equipment Recommendations  None recommended by PT    Recommendations for Other Services       Precautions / Restrictions Precautions Precautions: Fall Precaution Comments: abdominal binder Restrictions Weight Bearing Restrictions: No    Mobility  Bed Mobility Overal bed mobility: Needs Assistance Bed Mobility: Supine to Sit     Supine to sit: Supervision     General bed mobility comments: Cues for technique and hand placement.  Pt able to lift B limbs against gravity into bed.    Transfers Overall transfer level: Needs assistance Equipment used: Rolling walker (2 wheeled) Transfers: Sit to/from Stand Sit to Stand: Supervision         General transfer comment: Cues for safety and to keep RW close when backing to seated surface.  Pt has tendency to leave RW to the side.  May benefit from trialing mobility without AD  Ambulation/Gait Ambulation/Gait assistance: Supervision Ambulation Distance (Feet): 350 Feet Assistive device: Rolling walker (2 wheeled) Gait Pattern/deviations: Step-through pattern;Trunk flexed Gait velocity: decreased   General Gait Details: Cues for upright posture and pacing.     Stairs Stairs: Yes   Stair Management: One rail Right;Step to pattern;Forwards Number of Stairs: 9 General stair comments: Cues for sequencing and use of hand rail.  Pt reports she will stay down stairs when she returns home.    Wheelchair Mobility    Modified Rankin (Stroke Patients Only)        Balance Overall balance assessment: Needs assistance   Sitting balance-Leahy Scale: Good       Standing balance-Leahy Scale: Good                      Cognition Arousal/Alertness: Awake/alert Behavior During Therapy: WFL for tasks assessed/performed Overall Cognitive Status: Within Functional Limits for tasks assessed                      Exercises      General Comments        Pertinent Vitals/Pain Pain Assessment: 0-10 Pain Score: 5  Pain Location: abdomen Pain Descriptors / Indicators: Discomfort;Grimacing;Guarding;Operative site guarding Pain Intervention(s): Monitored during session;Repositioned    Home Living                      Prior Function            PT Goals (current goals can now be found in the care plan section) Acute Rehab PT Goals Patient Stated Goal: return home Potential to Achieve Goals: Good Progress towards PT goals: Progressing toward goals    Frequency  Min 3X/week    PT Plan Current plan remains appropriate    Co-evaluation             End of Session Equipment Utilized During Treatment: Gait belt Activity Tolerance: Patient limited by pain Patient left: in chair;with call bell/phone within reach     Time: 1420-1443 PT Time Calculation (min) (ACUTE ONLY): 23 min  Charges:  $  Gait Training: 8-22 mins $Therapeutic Activity: 8-22 mins                    G Codes:      Cristela Blue 03-18-16, 2:49 PM Governor Rooks, PTA pager [phone]

## 2016-02-23 NOTE — Consult Note (Signed)
CARDIOLOGY CONSULT NOTE   Patient ID: Lindsey Jenkins MRN: [id number], DOB/AGE: 1941/01/08   Admit date: 02/17/2016 Date of Consult: 02/23/2016   Primary Physician: Lindsey Shin, MD Primary Cardiologist: new  Pt. Profile  Lindsey Jenkins is a pleasant 75 yo female with PMH of HTN, HLD, and DMII, but no past cardiac history presented with ventral hernia repair, cardiology consulted post op for persistent tachycardia.   Problem List  Past Medical History:  Diagnosis Date  . Arthritis    hands, feet, ? type of arthritis, no treatment yet   . BACK PAIN, LUMBAR 05/21/2009   pt. reports bulging disc in lumbar region, since 2015  . Cataracts, bilateral   . 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 DIABETES MELLITUS, TYPE II 04/19/2007  . DIVERTICULOSIS, COLON 07/20/2006  . GASTROESOPHAGEAL REFLUX DISEASE 08/21/2007  . HIP PAIN 05/24/2010  . HYPERLIPIDEMIA 04/19/2007  . HYPERTENSION 04/19/2007  . MENOPAUSAL SYNDROME 04/19/2007  . OSTEOPOROSIS 04/19/2007    Past Surgical History:  Procedure Laterality Date  . BREAST BIOPSY  1990's   benign  . INSERTION OF MESH N/A 02/17/2016   Procedure: INSERTION OF MESH;  Surgeon: Autumn Messing III, MD;  Location: [facility];  Service: General;  Laterality: N/A;  . TUBAL LIGATION    . VENTRAL HERNIA REPAIR  02/17/2016   LAPAROSCOPIC VENTRAL HERNIA REPAIR WITH MESH (N/A)  . VENTRAL HERNIA REPAIR N/A 02/17/2016   Procedure: LAPAROSCOPIC VENTRAL HERNIA REPAIR WITH MESH;  Surgeon: Autumn Messing III, MD;  Location: [facility];  Service: General;  Laterality: N/A;     Allergies  Allergies  Allergen Reactions  . No Known Allergies     HPI   Lindsey Jenkins is a pleasant 75 yo female with PMH of HTN, HLD, and DMII, but no past cardiac history. He has been followed on outpatient setting by Dr. Renato Jenkins for the past several years. Based office note, her heart rate is typically ranged in the high 90s to low 100 since at least 2014. The only time her heart rate was normal was  during office visit in November 2013. She never had any chest pain or shortness of breath. She denies any recent lower extremity edema, orthopnea or paroxysmal nocturnal dyspnea. She had a long-standing history of ventral hernia and had a recent abdominal discomfort which led to this ventral hernia repair and hospitalization. However prior to this, she says she is able to take care of herself at home without any problem. She also says she is able to ambulate 2 blocks away from her house without significant shortness of breath. She denies previous smoking history, she drinks occasionally.  She underwent laparoscopic internal hernia repair on 02/17/2016. Postprocedure, she was noted to be persistently tachycardic with a heart rate in the 120s. Cardiology has been consulted for tachycardia. She says she continued to have 5 out of 10 abdominal pain, other than abdominal pain, she denies any other pain. She does feel weak, however denies any shortness of breath.    Inpatient Medications  . aspirin  325 mg Oral Daily  . colestipol  3 g Oral Daily  . famotidine  20 mg Oral BID  . feeding supplement (ENSURE ENLIVE)  237 mL Oral BID BM  . glimepiride  0.5 mg Oral Q breakfast  . heparin  5,000 Units Subcutaneous Q8H  . insulin aspart  0-9 Units Subcutaneous TID WC  . linagliptin  5 mg Oral Daily  . lisinopril  40 mg Oral Daily  . metFORMIN  500 mg Oral BID WC  . metoprolol tartrate  12.5 mg Oral BID  . NIFEdipine  30 mg Oral Daily  . simvastatin  80 mg Oral QHS    Family History Family History  Problem Relation Age of Onset  . Cancer Neg Hx      Social History Social History   Social History  . Marital status: Married    Spouse name: N/A  . Number of children: N/A  . Years of education: N/A   Occupational History  . 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 Smoking status: Never Smoker  . Smokeless tobacco: Never Used  . Alcohol use Yes     Comment:  occasionally, not regular  . Drug use: No  . Sexual activity: Not on file   Other Topics Concern  . Not on file   Social History Narrative  . No narrative on file     Review of Systems  General:  No chills, fever, night sweats or weight changes.  Cardiovascular:  No chest pain, dyspnea on exertion, edema, orthopnea, palpitations, paroxysmal nocturnal dyspnea. Dermatological: No rash, lesions/masses Respiratory: No cough, dyspnea Urologic: No hematuria, dysuria Abdominal:   No diarrhea, bright red blood per rectum, melena, or hematemesis +abdominal pain 5/10 Neurologic:  No visual changes, wkns, changes in mental status. All other systems reviewed and are otherwise negative except as noted above.  Physical Exam  Blood pressure 118/71, pulse (!) 121, temperature 98.4 F (36.9 C), temperature source Oral, resp. rate 16, height 5\' 5"  (1.651 m), weight 160 lb (72.6 kg), SpO2 94 %.  General: Pleasant, NAD Psych: Normal affect. Neuro: Alert and oriented X 3. Moves all extremities spontaneously. HEENT: Normal  Neck: Supple without bruits or JVD. Lungs:  Resp regular and unlabored, CTA. Heart: RRR no s3, s4, or murmurs. Abdomen: Abdomen in binder Extremities: No clubbing, cyanosis or edema. DP/PT/Radials 2+ and equal bilaterally.  Labs  No results for input(s): CKTOTAL, CKMB, TROPONINI in the last 72 hours. Lab Results  Component Value Date   WBC 4.8 02/22/2016   HGB 13.3 02/22/2016   HCT 40.6 02/22/2016   MCV 78.8 02/22/2016   PLT 273 02/22/2016    Recent Labs Lab 02/23/16 0413  NA 137  K 4.5  CL 108  CO2 21*  BUN 13  CREATININE 0.84  CALCIUM 9.1  GLUCOSE 157*   Lab Results  Component Value Date   CHOL 157 06/22/2015   HDL 50.80 06/22/2015   LDLCALC 90 06/22/2015   TRIG 81.0 06/22/2015   No results found for: DDIMER  Lab Results  Component Value Date   TSH 0.90 06/22/2015   Radiology/Studies  No results found.  ECG  Sinus tachycardia without  significant ST-T wave changes  ASSESSMENT AND PLAN  1. Persistent tachycardia:   - Likely related to pain post surgery. Treat pain. EKG showed sinus tachycardia without significant ST-T wave changes. May consider inpatient versus outpatient echocardiogram, if EF is normal, I think medical management with beta blocker is very reasonable. BB started by primary team. May need to cut back on lisinopril to 20mg  daily create BP room   - she has been on subcu heparin since surgery, given lack of SOB, doubt PE  2. Ventral hernia s/p repair: post op persistent ileus  3. HTN: well controlled on lisinopril  4. HLD: on zocor  5. DM II: on Amaryl and metformin at home   Signed, Almyra Deforest, PA-C 02/23/2016, 4:01 PM  Patient seen and examined. Agree with assessment and plan. Ms. Kesinger is a very pleasant 75 year old American female who has a history of type 2 diabetes mellitus, hypertension and hyperlipidemia without any prior awareness of other cardiac disease.  Specifically, she denies any episodes of exertional chest pain.  He denies any significant dyspnea.  She does not recall ever having an echo Doppler study.  She has consistently been documented to have an increased resting heart rate is typically has been in the high 90s to low 100 range.  On 02/17/2016 she underwent laparoscopic ventral hernia repair.  She has been noted to have sinus tachycardia postoperatively and her ECG today revealed sinus tachycardia 126 bpm without significant ST-T abnormalities.  She has been experiencing epigastric discomfort following her ventral hernia surgery.  I suspect her tachycardia is most likely catecholamine driven due to postoperative discomfort on top of her baseline increased heart rate.  Presently, I would recommend reducing her lisinopril from 40 mg to 20 mg and will start beta blocker therapy initially with metoprolol 25 mg twice a day and increase if needed.  Presently, she denies chest pain.  She denies  dyspnea.  Her lungs are clear.  Rhythm is regular but tachycardic with a faint 1/6 systolic murmur most likely a flow murmur.  There is no S3 gallop.  Her abdomen is wrapped.  She did not have palpable cords.  No lower extremity.  Homans sign is negative.  There was no edema.  Although I doubt a PE in the etiology of her sinus tachycardia. it may be worthwhile to check a d-dimer.  Her last TSH in November 2016 was 0.9, and follow-up TSH may be worthwhile make certain there is no hyperthyroidism.  I will schedule her for 2 echo Doppler study to make certain there is no structural heart disease.   Troy Sine, MD, [facility] 02/23/2016 5:10 PM

## 2016-02-23 NOTE — Care Management Important Message (Signed)
Important Message  Patient Details  Name: Lindsey Jenkins MRN: [id number] Date of Birth: August 14, 1940   Medicare Important Message Given:  Yes    Loann Quill 02/23/2016, 8:17 AM

## 2016-02-24 ENCOUNTER — Inpatient Hospital Stay (HOSPITAL_COMMUNITY): Payer: Medicare Other

## 2016-02-24 ENCOUNTER — Encounter (HOSPITAL_COMMUNITY): Payer: Self-pay | Admitting: Radiology

## 2016-02-24 DIAGNOSIS — I10 Essential (primary) hypertension: Secondary | ICD-10-CM

## 2016-02-24 LAB — BASIC METABOLIC PANEL
Anion gap: 8 (ref 5–15)
BUN: 12 mg/dL (ref 6–20)
CALCIUM: 8.8 mg/dL — AB (ref 8.9–10.3)
CO2: 20 mmol/L — ABNORMAL LOW (ref 22–32)
CREATININE: 0.97 mg/dL (ref 0.44–1.00)
Chloride: 107 mmol/L (ref 101–111)
GFR calc Af Amer: >60 mL/min (ref >60)
GFR, EST NON AFRICAN AMERICAN: 57 mL/min — AB (ref >60)
Glucose, Bld: 171 mg/dL — ABNORMAL HIGH (ref 65–99)
POTASSIUM: 4.9 mmol/L (ref 3.5–5.1)
SODIUM: 135 mmol/L (ref 135–145)

## 2016-02-24 LAB — GLUCOSE, CAPILLARY
GLUCOSE-CAPILLARY: 118 mg/dL — AB (ref 65–99)
Glucose-Capillary: 175 mg/dL — ABNORMAL HIGH (ref 65–99)
Glucose-Capillary: 144 mg/dL — ABNORMAL HIGH (ref 65–99)
Glucose-Capillary: 104 mg/dL — ABNORMAL HIGH (ref 65–99)

## 2016-02-24 LAB — T4, FREE: Free T4: 0.99 ng/dL (ref 0.61–1.12)

## 2016-02-24 LAB — CBC
HCT: 36.1 % (ref 36.0–46.0)
Hemoglobin: 12 g/dL (ref 12.0–15.0)
MCH: 26 pg (ref 26.0–34.0)
MCHC: 33.2 g/dL (ref 30.0–36.0)
MCV: 78.3 fL (ref 78.0–100.0)
PLATELETS: 369 10*3/uL (ref 150–400)
RBC: 4.61 MIL/uL (ref 3.87–5.11)
RDW: 14.6 % (ref 11.5–15.5)
WBC: 6.5 10*3/uL (ref 4.0–10.5)

## 2016-02-24 LAB — D-DIMER, QUANTITATIVE: D-Dimer, Quant: 19.32 ug/mL-FEU — ABNORMAL HIGH (ref 0.00–0.50)

## 2016-02-24 LAB — MAGNESIUM: MAGNESIUM: 1.2 mg/dL — AB (ref 1.7–2.4)

## 2016-02-24 LAB — ECHOCARDIOGRAM COMPLETE
Height: 65 in
Weight: 2560.02 oz

## 2016-02-24 LAB — TSH: TSH: 0.277 u[IU]/mL — AB (ref 0.350–4.500)

## 2016-02-24 MED ORDER — IOPAMIDOL (ISOVUE-370) INJECTION 76%
INTRAVENOUS | Status: AC
  Administered 2016-02-24: 100 mL
  Filled 2016-02-24: qty 100

## 2016-02-24 MED ORDER — MAGNESIUM SULFATE 2 GM/50ML IV SOLN
2.0000 g | Freq: Once | INTRAVENOUS | Status: AC
  Administered 2016-02-24: 2 g via INTRAVENOUS
  Filled 2016-02-24: qty 50

## 2016-02-24 MED ORDER — METOPROLOL TARTRATE 25 MG PO TABS
25.0000 mg | ORAL_TABLET | Freq: Two times a day (BID) | ORAL | Status: DC
  Administered 2016-02-24: 25 mg via ORAL
  Filled 2016-02-24 (×2): qty 1

## 2016-02-24 MED ORDER — LISINOPRIL 20 MG PO TABS
20.0000 mg | ORAL_TABLET | Freq: Every day | ORAL | Status: DC
  Administered 2016-02-24: 20 mg via ORAL
  Filled 2016-02-24: qty 1

## 2016-02-24 MED ORDER — DIATRIZOATE MEGLUMINE & SODIUM 66-10 % PO SOLN
ORAL | Status: AC
  Administered 2016-02-24: 13:00:00
  Filled 2016-02-24: qty 30

## 2016-02-24 NOTE — Progress Notes (Signed)
  Echocardiogram 2D Echocardiogram has been performed.  Lindsey Jenkins 02/24/2016, 9:39 AM

## 2016-02-24 NOTE — Progress Notes (Signed)
Physical Therapy Treatment Patient Details Name: Lindsey Jenkins MRN: [id number] DOB: June 12, 1941 Today's Date: 02/24/2016    History of Present Illness Pt is a 75 y/o female s/p ventral hernis repair. Hospital course complicated by ileus. PMH including but not limited to DM and HTN.    PT Comments    Pt received in recliner on arrival.  Pt fatigued but agreeable to treatment.  Upon standing transfer tech entered room to take patient to CT.  Tx limited due to testing.  Will attempt to follow up in am to progress therapy.    Follow Up Recommendations  Supervision for mobility/OOB     Equipment Recommendations  None recommended by PT    Recommendations for Other Services       Precautions / Restrictions Precautions Precautions: Fall Precaution Comments: abdominal binder Restrictions Weight Bearing Restrictions: No    Mobility  Bed Mobility Overal bed mobility: Needs Assistance Bed Mobility: Sit to Supine       Sit to supine: Min assist   General bed mobility comments: Pt required assist to lift LEs into bed.  Pt able to follow commands for boosting in supine to assist with positioning.    Transfers Overall transfer level: Needs assistance Equipment used: Rolling walker (2 wheeled) Transfers: Sit to/from Stand Sit to Stand: Supervision         General transfer comment: Close supervision as patient presents with unsteadiness.  Pt noticeably weak during session.   Ambulation/Gait Ambulation/Gait assistance: Supervision Ambulation Distance (Feet): 10 Feet (from chair by window to bed.  transfer Tech to take patient to CT scan which limited mobility during todays session.  ) Assistive device: Rolling walker (2 wheeled) Gait Pattern/deviations: Step-to pattern Gait velocity: decreased   General Gait Details: Cues for turning and back to prepare for safe transfer back to bed.     Stairs            Wheelchair Mobility    Modified Rankin (Stroke Patients Only)       Balance Overall balance assessment: Needs assistance   Sitting balance-Leahy Scale: Good       Standing balance-Leahy Scale: Fair                      Cognition Arousal/Alertness: Awake/alert Behavior During Therapy: WFL for tasks assessed/performed Overall Cognitive Status: Within Functional Limits for tasks assessed                      Exercises      General Comments        Pertinent Vitals/Pain Pain Assessment: Faces Faces Pain Scale: Hurts even more Pain Location: abdomen Pain Descriptors / Indicators: Discomfort;Guarding;Grimacing Pain Intervention(s): Monitored during session;Repositioned    Home Living                      Prior Function            PT Goals (current goals can now be found in the care plan section) Acute Rehab PT Goals Patient Stated Goal: return home Potential to Achieve Goals: Good Progress towards PT goals: Not progressing toward goals - comment (tx limited as patient leaving for testing.  )    Frequency  Min 3X/week    PT Plan Current plan remains appropriate    Co-evaluation             End of Session Equipment Utilized During Treatment: Gait belt Activity Tolerance: Patient limited by pain  Patient left: in bed (with transfer tech to leave for CT scan.  )     Time: [id number] PT Time Calculation (min) (ACUTE ONLY): 8 min  Charges:  $Therapeutic Activity: 8-22 mins                    G Codes:      Cristela Blue 2016/03/22, 4:06 PM  Governor Rooks, PTA pager [phone]

## 2016-02-24 NOTE — Progress Notes (Signed)
7 Days Post-Op  Subjective: Had n/v yesterday. No flatus/bm. Not much pain. Ambulated. No sob  Objective: Vital signs in last 24 hours: Temp:  [97.8 F (36.6 C)-98.9 F (37.2 C)] 97.8 F (36.6 C) (08/02 0507) Pulse Rate:  [121-133] 124 (08/02 0507) Resp:  [16-17] 17 (08/02 0507) BP: (118-151)/(60-79) 130/60 (08/02 0507) SpO2:  [92 %-94 %] 92 % (08/02 0507) Last BM Date: 02/22/16  Intake/Output from previous day: 08/01 0701 - 08/02 0700 In: 480 [P.O.:480] Out: 1500 [Urine:1500] Intake/Output this shift: No intake/output data recorded.  Asleep, easily arousable. Nad cta b/l Reg but tachy Still some abd distension; essentially nontender, hypoBS, incisions c/d/i No edema  Lab Results:   Recent Labs  02/22/16 0435  WBC 4.8  HGB 13.3  HCT 40.6  PLT 273   BMET  Recent Labs  02/23/16 0413 02/24/16 0508  NA 137 135  K 4.5 4.9  CL 108 107  CO2 21* 20*  GLUCOSE 157* 171*  BUN 13 12  CREATININE 0.84 0.97  CALCIUM 9.1 8.8*   PT/INR No results for input(s): LABPROT, INR in the last 72 hours. ABG No results for input(s): PHART, HCO3 in the last 72 hours.  Invalid input(s): PCO2, PO2  Studies/Results: No results found.  Anti-infectives: Anti-infectives    Start     Dose/Rate Route Frequency Ordered Stop   02/17/16 0800  ceFAZolin (ANCEF) IVPB 2g/100 mL premix     2 g 200 mL/hr over 30 Minutes Intravenous To [facility] 02/16/16 1059 02/17/16 0829      Assessment/Plan: s/p Procedure(s): LAPAROSCOPIC VENTRAL HERNIA REPAIR WITH MESH (N/A) INSERTION OF MESH (N/A)  Ileus - has ongoing ileus which is a little atypical this far out. Mag is low. Back diet back down to clears. Will check abd xrays. No fever. Will check cbc.   Tachycardia - appreciates cards input. Medications changed per their rec.   Low TSH - will defer to cards about management  Elevated dDimer - not sure what to make of this. No sob, no calf pain or swelling. But given more  elevated HR than her baseline and elevated D dimer - ? PE, DVT. Will discuss with cards. If decide to get CT PE will also scan abd pelvis   [name]. [name], MD, FACS General, Bariatric, & Minimally Invasive Surgery [facility], [place]   LOS: 2 days    Gayland Curry 02/24/2016

## 2016-02-24 NOTE — Progress Notes (Signed)
Patient Profile: 75 yo female with PMH of HTN, HLD, and DMII, but no past cardiac history presented with ventral hernia repair, cardiology consulted post op for persistent tachycardia. D-dimer elevated at 19.32. TSH also suppressed at 0.277.   Subjective: No palpitations and no chest pain. Mild dyspnea with ambulation and with talking. Still with post op abdominal pain. No BM today.   Objective: Vital signs in last 24 hours: Temp:  [97.8 F (36.6 C)-98.9 F (37.2 C)] 97.8 F (36.6 C) (08/02 0507) Pulse Rate:  [121-133] 124 (08/02 0507) Resp:  [16-17] 17 (08/02 0507) BP: (118-151)/(60-79) 130/60 (08/02 0507) SpO2:  [92 %-94 %] 92 % (08/02 0507) Last BM Date: 02/22/16  Intake/Output from previous day: 08/01 0701 - 08/02 0700 In: 480 [P.O.:480] Out: 1500 [Urine:1500] Intake/Output this shift: Total I/O In: 2778.8 [I.V.:2728.8; IV Piggyback:50] Out: 200 [Urine:200]  Medications Current Facility-Administered Medications  Medication Dose Route Frequency Provider Last Rate Last Dose  . 0.9 %  sodium chloride infusion   Intravenous Continuous Greer Pickerel, MD 75 mL/hr at 02/24/16 1015    . aspirin tablet 325 mg  325 mg Oral Daily Autumn Messing III, MD   325 mg at 02/24/16 1014  . colestipol (COLESTID) tablet 3 g  3 g Oral Daily Autumn Messing III, MD   3 g at 02/24/16 1015  . famotidine (PEPCID) tablet 20 mg  20 mg Oral BID Autumn Messing III, MD   20 mg at 02/24/16 1014  . feeding supplement (ENSURE ENLIVE) (ENSURE ENLIVE) liquid 237 mL  237 mL Oral BID BM Greer Pickerel, MD   237 mL at 02/24/16 1015  . glimepiride (AMARYL) tablet 0.5 mg  0.5 mg Oral Q breakfast Autumn Messing III, MD   0.5 mg at 02/24/16 1014  . heparin injection 5,000 Units  5,000 Units Subcutaneous Q8H Autumn Messing III, MD   5,000 Units at 02/24/16 1249  . insulin aspart (novoLOG) injection 0-9 Units  0-9 Units Subcutaneous TID WC Autumn Messing III, MD   2 Units at 02/24/16 1015  . iopamidol (ISOVUE-370) 76 % injection           .  linagliptin (TRADJENTA) tablet 5 mg  5 mg Oral Daily Autumn Messing III, MD   5 mg at 02/24/16 1014  . lisinopril (PRINIVIL,ZESTRIL) tablet 20 mg  20 mg Oral Daily Greer Pickerel, MD   20 mg at 02/24/16 1014  . menthol-cetylpyridinium (CEPACOL) lozenge 3 mg  1 lozenge Oral PRN Autumn Messing III, MD   3 mg at 02/18/16 [phone]  . metFORMIN (GLUCOPHAGE) tablet 500 mg  500 mg Oral BID WC Autumn Messing III, MD   500 mg at 02/24/16 1015  . methocarbamol (ROBAXIN) tablet 500 mg  500 mg Oral Q6H PRN Autumn Messing III, MD   500 mg at 02/18/16 0109  . metoprolol tartrate (LOPRESSOR) tablet 25 mg  25 mg Oral BID Greer Pickerel, MD   25 mg at 02/24/16 1014  . morphine 2 MG/ML injection 1-4 mg  1-4 mg Intravenous Q1H PRN Autumn Messing III, MD      . NIFEdipine (PROCARDIA-XL/ADALAT-CC/NIFEDICAL-XL) 24 hr tablet 30 mg  30 mg Oral Daily Autumn Messing III, MD   30 mg at 02/24/16 1014  . ondansetron (ZOFRAN-ODT) disintegrating tablet 4 mg  4 mg Oral Q6H PRN Autumn Messing III, MD       Or  . ondansetron [facility]) injection 4 mg  4 mg Intravenous Q6H PRN Autumn Messing III, MD   4  mg at 02/24/16 1137  . oxyCODONE-acetaminophen (PERCOCET/ROXICET) 5-325 MG per tablet 1-2 tablet  1-2 tablet Oral Q4H PRN Autumn Messing III, MD   2 tablet at 02/24/16 0551  . simvastatin (ZOCOR) tablet 80 mg  80 mg Oral QHS Autumn Messing III, MD   80 mg at 02/23/16 2114  . traMADol (ULTRAM) tablet 50 mg  50 mg Oral Q6H PRN Autumn Messing III, MD        PE: General appearance: alert, cooperative and no distress Neck: no carotid bruit and no JVD Lungs: clear to auscultation bilaterally Heart: regular rhythm, tachy rate, S1, S2 normal, no murmur, click, rub or gallop Abdomen: distended abdomen, + BS Extremities: no LEE Pulses: 2+ and symmetric Skin: warm and dry Neurologic: Grossly normal  Lab Results:   Recent Labs  02/22/16 0435 02/24/16 1045  WBC 4.8 6.5  HGB 13.3 12.0  HCT 40.6 36.1  PLT 273 369   BMET  Recent Labs  02/22/16 0435 02/23/16 0413 02/24/16 0508  NA 136 137  135  K 5.3* 4.5 4.9  CL 106 108 107  CO2 24 21* 20*  GLUCOSE 167* 157* 171*  BUN 20 13 12   CREATININE 1.05* 0.84 0.97  CALCIUM 9.5 9.1 8.8*    Studies/Results: 2D Echo  Study Conclusions  - Left ventricle: The cavity size was normal. Wall thickness was   increased in a pattern of moderate LVH. Systolic function was   vigorous. The estimated ejection fraction was in the range of 65%   to 70%. Wall motion was normal; there were no regional wall   motion abnormalities. Doppler parameters are consistent with   abnormal left ventricular relaxation (grade 1 diastolic   dysfunction). - Pulmonary arteries: PA peak pressure: 40 mm Hg (S). Right ventricle:  The cavity size was normal. Wall thickness was normal. Systolic function was normal. Right atrium:  The atrium was normal in size.  Assessment/Plan   Active Problems:   Ventral hernia without obstruction or gangrene   Tachycardia with 121 - 140 beats per minute   1. Persistent tachycardia:                        - In the setting of recent surgery/ pain post surgery, Elevated D-dimer of 19 and suppressed TSH.            - not currently on telemetry but physical exam reveals reg rhythm with tachy rate. BP stable. No CP nor palpitations. + for mild dyspnea.             - 2D echo shows vigorous LV systolic function with EF of 65-70%. Normal RA size and RV function.            - Given sinus tach with D-dimer of 19, she will need a chest CT to exclude PE            - Given low TSH, check Free T3, T4 to rule out hyperthyroidism.             - K is WNL however Mg was low earlier today at 1.2 Supplemental Mg sulfate IV given earlier today. Repeat Mglevel in the am.             - continue BB therapy with metoprolol.                        - Control post op pain  2. Ventral hernia s/p  repair: post op persistent ileus  3. HTN: well controlled on lisinopril  4. HLD: on zocor  5. DM II: on Amaryl and metformin at home    LOS: 2  days    Brittainy M. Rosita Fire, PA-C 02/24/2016 1:59 PM  ------------------------------------------------------------------- ECHO Study Conclusions  - Left ventricle: The cavity size was normal. Wall thickness was   increased in a pattern of moderate LVH. Systolic function was   vigorous. The estimated ejection fraction was in the range of 65%   to 70%. Wall motion was normal; there were no regional wall   motion abnormalities. Doppler parameters are consistent with   abnormal left ventricular relaxation (grade 1 diastolic   dysfunction). - Pulmonary arteries: PA peak pressure: 40 mm Hg (S).   Patient seen and examined. Agree with assessment and plan. Echo dat as above; Nl RV size and fx with moderate LVH and vigorous systolic fxn.  Agree with planned CT imaging with d-dimer positive and full dose anticoagulate if positive (she has been on heparin sq q 8 hrs).  Pt c/o abdominal discomfort; no chest pain. Check free T3 and free T4 to evaluate for hyperthyroidism.    Troy Sine, MD, [facility] 02/24/2016 3:59 PM

## 2016-02-25 DIAGNOSIS — E785 Hyperlipidemia, unspecified: Secondary | ICD-10-CM

## 2016-02-25 DIAGNOSIS — N179 Acute kidney failure, unspecified: Secondary | ICD-10-CM

## 2016-02-25 DIAGNOSIS — K5669 Other intestinal obstruction: Secondary | ICD-10-CM

## 2016-02-25 LAB — GLUCOSE, CAPILLARY
GLUCOSE-CAPILLARY: 108 mg/dL — AB (ref 65–99)
GLUCOSE-CAPILLARY: 115 mg/dL — AB (ref 65–99)
GLUCOSE-CAPILLARY: 134 mg/dL — AB (ref 65–99)
Glucose-Capillary: 92 mg/dL (ref 65–99)
Glucose-Capillary: 119 mg/dL — ABNORMAL HIGH (ref 65–99)

## 2016-02-25 LAB — BASIC METABOLIC PANEL
ANION GAP: 7 (ref 5–15)
BUN: 25 mg/dL — AB (ref 6–20)
CO2: 20 mmol/L — AB (ref 22–32)
Calcium: 8.7 mg/dL — ABNORMAL LOW (ref 8.9–10.3)
Chloride: 108 mmol/L (ref 101–111)
Creatinine, Ser: 1.54 mg/dL — ABNORMAL HIGH (ref 0.44–1.00)
GFR calc Af Amer: 38 mL/min — ABNORMAL LOW (ref >60)
GFR, EST NON AFRICAN AMERICAN: 33 mL/min — AB (ref >60)
GLUCOSE: 98 mg/dL (ref 65–99)
POTASSIUM: 5.4 mmol/L — AB (ref 3.5–5.1)
Sodium: 135 mmol/L (ref 135–145)

## 2016-02-25 LAB — T3, FREE: T3 FREE: 1.4 pg/mL — AB (ref 2.0–4.4)

## 2016-02-25 LAB — MAGNESIUM: Magnesium: 2 mg/dL (ref 1.7–2.4)

## 2016-02-25 MED ORDER — INSULIN ASPART 100 UNIT/ML ~~LOC~~ SOLN
0.0000 [IU] | SUBCUTANEOUS | Status: DC
  Administered 2016-02-26 (×2): 1 [IU] via SUBCUTANEOUS
  Administered 2016-02-26: 2 [IU] via SUBCUTANEOUS
  Administered 2016-02-26: 1 [IU] via SUBCUTANEOUS
  Administered 2016-02-27: 2 [IU] via SUBCUTANEOUS
  Administered 2016-02-27 (×2): 3 [IU] via SUBCUTANEOUS

## 2016-02-25 MED ORDER — METOPROLOL TARTRATE 5 MG/5ML IV SOLN
5.0000 mg | Freq: Four times a day (QID) | INTRAVENOUS | Status: DC
  Administered 2016-02-25 – 2016-02-26 (×7): 5 mg via INTRAVENOUS
  Filled 2016-02-25 (×6): qty 5

## 2016-02-25 NOTE — Care Management Important Message (Signed)
Important Message  Patient Details  Name: Lindsey Jenkins MRN: [id number] Date of Birth: 11/11/1940   Medicare Important Message Given:  Yes    Loann Quill 02/25/2016, 11:05 AM

## 2016-02-25 NOTE — Progress Notes (Signed)
OT Cancellation Note  Patient Details Name: Lindsey Jenkins MRN: [id number] DOB: September 17, 1940   Cancelled Treatment:    Reason Eval/Treat Not Completed: Medical issues which prohibited therapy (Pt with resting HR around 120. Will follow.)  Malka So 02/25/2016, 3:46 PM  [phone]

## 2016-02-25 NOTE — Progress Notes (Signed)
8 Days Post-Op  Subjective: Events noted. Placed NG tube based on persistent emesis and degree of bowel distension on imaging. About 1600 cc bilious output out of ng; no flatus/bm. Sore discomfort in left mid abd. No cough/sputum.   Objective: Vital signs in last 24 hours: Temp:  [97.9 F (36.6 C)-99.4 F (37.4 C)] 98.9 F (37.2 C) (08/03 0609) Pulse Rate:  [112-125] 125 (08/03 0609) Resp:  [18] 18 (08/03 0609) BP: (100-114)/(50-58) 103/50 (08/03 0609) SpO2:  [92 %] 92 % (08/03 0609) Last BM Date: 02/22/16  Intake/Output from previous day: 08/02 0701 - 08/03 0700 In: 3342.5 [I.V.:3292.5; IV Piggyback:50] Out: 2700 [Urine:1100; Emesis/NG output:1600] Intake/Output this shift: No intake/output data recorded.  Alert, nad Cta; IS 850 Tachy Soft, distended perhaps a little less. No BS. Incision c/d/i; not really that tender No edema  Lab Results:   Recent Labs  02/24/16 1045  WBC 6.5  HGB 12.0  HCT 36.1  PLT 369   BMET  Recent Labs  02/24/16 0508 02/25/16 0229  NA 135 135  K 4.9 5.4*  CL 107 108  CO2 20* 20*  GLUCOSE 171* 98  BUN 12 25*  CREATININE 0.97 1.54*  CALCIUM 8.8* 8.7*   PT/INR No results for input(s): LABPROT, INR in the last 72 hours. ABG No results for input(s): PHART, HCO3 in the last 72 hours.  Invalid input(s): PCO2, PO2  Studies/Results: Ct Angio Chest Pe W Or Wo Contrast  Result Date: 02/24/2016 CLINICAL DATA:  Abnormal chest x-ray earlier today questioning a right perihilar mass. Elevated D-dimer and tachycardia. Ventral hernia repair with mesh on 02/17/2016. EXAM: CT ANGIOGRAPHY CHEST CT ABDOMEN AND PELVIS WITH CONTRAST TECHNIQUE: Multidetector CT imaging of the chest was performed using the standard protocol during bolus administration of intravenous contrast. Multiplanar CT image reconstructions and MIPs were obtained to evaluate the vascular anatomy. Multidetector CT imaging of the abdomen and pelvis was performed using the standard  protocol during bolus administration of intravenous contrast. CONTRAST:  100 ml Isovue 370 IV. Oral contrast was also administered. COMPARISON:  No prior CT. Acute abdomen series earlier today is correlated. FINDINGS: CTA CHEST FINDINGS Cardiovascular: Contrast opacification of the pulmonary arteries is good. Respiratory motion blurs images throughout the chest. Overall, the study is of moderate to good diagnostic quality. No filling defects identified in either main pulmonary artery or their central branches in either lung to suggest pulmonary embolism. Heart mildly enlarged. No pericardial effusion. Mild atherosclerosis involving a diagonal branch of the LAD. Mild to moderate atherosclerosis involving the thoracic and upper abdominal aorta without evidence of aneurysm. Mediastinum/Lymph nodes: No mediastinal mass. No pathologic lymphadenopathy. Oral contrast material within a dilated esophagus throughout. Small hiatal hernia with possible stricture at the EG junction. Multiple nodules involving both lobes of the thyroid gland. Lungs/Pleura: Peripheral dense consolidation in the posterior superior right lower lobe and dense consolidation in the deep posterior inferior right lower lobe. Dense consolidation to a lesser degree in the deep posterior inferior left lower lobe. Small bilateral pleural effusions. No evidence of interstitial lung disease. Central airways patent with isolated bronchial wall thickening involving the right lower lobe bronchi. Musculoskeletal: Degenerative disc disease and spondylosis throughout the thoracic spine. No acute abnormalities. CT ABDOMEN and PELVIS FINDINGS Hepatobiliary: Liver normal in size and appearance. Gallbladder contracted without evidence of cholelithiasis. Pancreas: Mildly atrophic without focal parenchymal abnormality. Spleen: Normal in size and appearance. Adrenals/Urinary tract: Normal appearing adrenal glands. Kidneys normal in size and appearance without focal  parenchymal abnormality.  No evidence of urinary tract calculi or obstruction. Normal-appearing urinary bladder. Stomach/Bowel: Small hiatal hernia as mentioned above. Moderate gastric distention. Numerous dilated, fluid-filled loops of small bowel in the abdomen and pelvis, with a transition to more normal caliber small bowel in the upper pelvis. Distal small bowel decompressed. Entire colon decompressed. Several small bowel loops in the left side of the abdomen are position lateral to the descending colon. Scattered sigmoid colon diverticula without evidence of acute diverticulitis. Normal appendix in the right mid abdomen and right upper pelvis. Vascular/Lymphatic: Moderate to severe aortoiliac atherosclerosis without aneurysm. Normal-appearing portal venous and systemic venous systems. No pathologic lymphadenopathy. Reproductive: Uterus atrophic.  No adnexal masses. Other: Small amount of ascites dependently in the pelvis. Diffuse body wall edema. Musculoskeletal: Multilevel degenerative disc disease, spondylosis and facet degenerative changes involving the lumbar spine, worst at L5-S1. No acute abnormalities. Review of the MIP images confirms the above findings. IMPRESSION: 1. No evidence of pulmonary embolism. 2. Pneumonia involving the lower lobes bilaterally. Dense airspace consolidation in the upper posterior right lower lobe accounts for the chest x-ray findings earlier today. 3. Small bilateral pleural effusions. 4. Partial small bowel obstruction with the transition to normal caliber small bowel in the upper pelvis. 5. Possible internal hernia as there are numerous small bowel loops lateral to the descending colon. 6. Small hiatal hernia. Possible stricture at the EG junction. Oral contrast material throughout the mildly distended esophagus likely indicates reflux and/or esophageal dysmotility. 7. Small amount of ascites dependently in the pelvis. 8. Generalized atherosclerosis. 9. Multinodular goiter.  Electronically Signed   By: Evangeline Dakin M.D.   On: 02/24/2016 17:13   Ct Abdomen Pelvis W Contrast  Result Date: 02/24/2016 CLINICAL DATA:  Abnormal chest x-ray earlier today questioning a right perihilar mass. Elevated D-dimer and tachycardia. Ventral hernia repair with mesh on 02/17/2016. EXAM: CT ANGIOGRAPHY CHEST CT ABDOMEN AND PELVIS WITH CONTRAST TECHNIQUE: Multidetector CT imaging of the chest was performed using the standard protocol during bolus administration of intravenous contrast. Multiplanar CT image reconstructions and MIPs were obtained to evaluate the vascular anatomy. Multidetector CT imaging of the abdomen and pelvis was performed using the standard protocol during bolus administration of intravenous contrast. CONTRAST:  100 ml Isovue 370 IV. Oral contrast was also administered. COMPARISON:  No prior CT. Acute abdomen series earlier today is correlated. FINDINGS: CTA CHEST FINDINGS Cardiovascular: Contrast opacification of the pulmonary arteries is good. Respiratory motion blurs images throughout the chest. Overall, the study is of moderate to good diagnostic quality. No filling defects identified in either main pulmonary artery or their central branches in either lung to suggest pulmonary embolism. Heart mildly enlarged. No pericardial effusion. Mild atherosclerosis involving a diagonal branch of the LAD. Mild to moderate atherosclerosis involving the thoracic and upper abdominal aorta without evidence of aneurysm. Mediastinum/Lymph nodes: No mediastinal mass. No pathologic lymphadenopathy. Oral contrast material within a dilated esophagus throughout. Small hiatal hernia with possible stricture at the EG junction. Multiple nodules involving both lobes of the thyroid gland. Lungs/Pleura: Peripheral dense consolidation in the posterior superior right lower lobe and dense consolidation in the deep posterior inferior right lower lobe. Dense consolidation to a lesser degree in the deep  posterior inferior left lower lobe. Small bilateral pleural effusions. No evidence of interstitial lung disease. Central airways patent with isolated bronchial wall thickening involving the right lower lobe bronchi. Musculoskeletal: Degenerative disc disease and spondylosis throughout the thoracic spine. No acute abnormalities. CT ABDOMEN and PELVIS FINDINGS Hepatobiliary: Liver normal  in size and appearance. Gallbladder contracted without evidence of cholelithiasis. Pancreas: Mildly atrophic without focal parenchymal abnormality. Spleen: Normal in size and appearance. Adrenals/Urinary tract: Normal appearing adrenal glands. Kidneys normal in size and appearance without focal parenchymal abnormality. No evidence of urinary tract calculi or obstruction. Normal-appearing urinary bladder. Stomach/Bowel: Small hiatal hernia as mentioned above. Moderate gastric distention. Numerous dilated, fluid-filled loops of small bowel in the abdomen and pelvis, with a transition to more normal caliber small bowel in the upper pelvis. Distal small bowel decompressed. Entire colon decompressed. Several small bowel loops in the left side of the abdomen are position lateral to the descending colon. Scattered sigmoid colon diverticula without evidence of acute diverticulitis. Normal appendix in the right mid abdomen and right upper pelvis. Vascular/Lymphatic: Moderate to severe aortoiliac atherosclerosis without aneurysm. Normal-appearing portal venous and systemic venous systems. No pathologic lymphadenopathy. Reproductive: Uterus atrophic.  No adnexal masses. Other: Small amount of ascites dependently in the pelvis. Diffuse body wall edema. Musculoskeletal: Multilevel degenerative disc disease, spondylosis and facet degenerative changes involving the lumbar spine, worst at L5-S1. No acute abnormalities. Review of the MIP images confirms the above findings. IMPRESSION: 1. No evidence of pulmonary embolism. 2. Pneumonia involving the  lower lobes bilaterally. Dense airspace consolidation in the upper posterior right lower lobe accounts for the chest x-ray findings earlier today. 3. Small bilateral pleural effusions. 4. Partial small bowel obstruction with the transition to normal caliber small bowel in the upper pelvis. 5. Possible internal hernia as there are numerous small bowel loops lateral to the descending colon. 6. Small hiatal hernia. Possible stricture at the EG junction. Oral contrast material throughout the mildly distended esophagus likely indicates reflux and/or esophageal dysmotility. 7. Small amount of ascites dependently in the pelvis. 8. Generalized atherosclerosis. 9. Multinodular goiter. Electronically Signed   By: Evangeline Dakin M.D.   On: 02/24/2016 17:13   Dg Abd Acute W/chest  Result Date: 02/24/2016 CLINICAL DATA:  Status post laparoscopic ventral hernia repair with on going vomiting and distention. EXAM: DG ABDOMEN ACUTE W/ 1V CHEST COMPARISON:  None. FINDINGS: Frontal view of the chest shows abnormal right parahilar density. Cardiopericardial silhouette is at upper limits of normal for size. Bibasilar atelectasis noted with tiny bilateral pleural effusions. No evidence for pulmonary edema. Upright film shows no evidence for intraperitoneal free air. Supine views of the abdomen show diffuse gaseous small bowel distention with associated air-fluid levels. Small bowel loops measure up to 5 cm in diameter. IMPRESSION: 1. Focal abnormal right parahilar opacity. Imaging features are not entirely consistent with pneumonia given the size and relatively well-defined margins. Parahilar mass is a concern. CT chest with contrast recommended to further evaluate. 2. Diffuse gaseous bowel distention with air-fluid levels. Patient is postop day 7 which is gaining towards the latter timeframe of typical postoperative adynamic ileus. Given that there is limited colonic gas visualized, evolving small bowel obstruction must be  considered. Electronically Signed   By: Misty Stanley M.D.   On: 02/24/2016 09:05    Anti-infectives: Anti-infectives    Start     Dose/Rate Route Frequency Ordered Stop   02/17/16 0800  ceFAZolin (ANCEF) IVPB 2g/100 mL premix     2 g 200 mL/hr over 30 Minutes Intravenous To [facility] 02/16/16 1059 02/17/16 0829      Assessment/Plan: s/p Procedure(s): LAPAROSCOPIC VENTRAL HERNIA REPAIR WITH MESH (N/A) INSERTION OF MESH (N/A)  Ileus - cont bowel rest, npo, NG tube to LIWS. The intestines were not manipulated during surgery so  I don't think she has internal hernia. No wbc, no fever, no wall thickening,   HTN/tachy - doubt she will absorb oral meds. Convert lopressor to IV. Hold lisinopril given bump in Cr. Will ask cards about other oral BP med, no PE  Pulm - consolidation in both lobes. Clinically pt doesn't have PNA, no cough, no sputum, no wbc, no decrease in oxygenation. Stress pulm toilet, IS, flutter valve  Thyroid labs - defer to cards for management  Renal - uop ok. Cr bump. prob combo of fluid losses and IV dye, prob will cont to increase. Avoid nephrotoxic meds  F/E/N - hyperkalemia - no k in ivf. Given NG tube losses will increase mIVF to 125. Repeat labs in am. If doesn't open up in next day or so will need picc/tpn  VTE prophylaxis - scds, subcu heparin  Cont pt/ot Discussed plan with pt.   [name]. [name], MD, FACS General, Bariatric, & Minimally Invasive Surgery [facility], [place]   LOS: 3 days    Gayland Curry 02/25/2016

## 2016-02-25 NOTE — Progress Notes (Signed)
Patient Profile: 75 yo female with PMH of HTN, HLD, and DMII, but no past cardiac history presented with ventral hernia repair, cardiology consulted post op for persistent tachycardia. D-dimer elevated at 19.32. TSH also suppressed at 0.277.   Subjective: No palpitations and no chest pain. Mild dyspnea with ambulation and with talking. Still with post op abdominal pain. No BM today.   Objective: Vital signs in last 24 hours: Temp:  [97.9 F (36.6 C)-99.4 F (37.4 C)] 98.9 F (37.2 C) (08/03 0609) Pulse Rate:  [112-125] 125 (08/03 0609) Resp:  [18] 18 (08/03 0609) BP: (100-114)/(50-58) 103/50 (08/03 0609) SpO2:  [92 %] 92 % (08/03 0609) Last BM Date: 02/22/16  Intake/Output from previous day: 08/02 0701 - 08/03 0700 In: 3342.5 [I.V.:3292.5; IV Piggyback:50] Out: 2700 [Urine:1100; Emesis/NG output:1600] Intake/Output this shift: No intake/output data recorded.  Medications Current Facility-Administered Medications  Medication Dose Route Frequency Provider Last Rate Last Dose  . 0.9 %  sodium chloride infusion   Intravenous Continuous Greer Pickerel, MD 125 mL/hr at 02/25/16 0951    . heparin injection 5,000 Units  5,000 Units Subcutaneous Q8H Autumn Messing III, MD   5,000 Units at 02/25/16 [phone]  . insulin aspart (novoLOG) injection 0-9 Units  0-9 Units Subcutaneous TID WC Autumn Messing III, MD   1 Units at 02/24/16 1700  . menthol-cetylpyridinium (CEPACOL) lozenge 3 mg  1 lozenge Oral PRN Autumn Messing III, MD   3 mg at 02/18/16 [phone]  . methocarbamol (ROBAXIN) tablet 500 mg  500 mg Oral Q6H PRN Autumn Messing III, MD   500 mg at 02/18/16 0109  . metoprolol (LOPRESSOR) injection 5 mg  5 mg Intravenous Q6H Greer Pickerel, MD   5 mg at 02/25/16 3159  . morphine 2 MG/ML injection 1-4 mg  1-4 mg Intravenous Q1H PRN Autumn Messing III, MD   2 mg at 02/25/16 0244  . NIFEdipine (PROCARDIA-XL/ADALAT-CC/NIFEDICAL-XL) 24 hr tablet 30 mg  30 mg Oral Daily Autumn Messing III, MD   30 mg at 02/25/16 0949  . ondansetron  (ZOFRAN-ODT) disintegrating tablet 4 mg  4 mg Oral Q6H PRN Autumn Messing III, MD       Or  . ondansetron [facility]) injection 4 mg  4 mg Intravenous Q6H PRN Autumn Messing III, MD   4 mg at 02/24/16 1137    PE: General appearance: alert, cooperative and no distress Neck: no carotid bruit and no JVD  NG tube now in place Lungs: clear to auscultation bilaterally Heart: regular rhythm, tachycardic at 110, S1, S2 normal, 1/6 sem, no click,  Abdomen: distended abdomen, + BS Extremities: no LEE; negative Homan's sign  Pulses: 2+ and symmetric Skin: warm and dry Neurologic: Grossly normal  Lab Results:   Recent Labs  02/24/16 1045  WBC 6.5  HGB 12.0  HCT 36.1  PLT 369   BMET  Recent Labs  02/23/16 0413 02/24/16 0508 02/25/16 0229  NA 137 135 135  K 4.5 4.9 5.4*  CL 108 107 108  CO2 21* 20* 20*  GLUCOSE 157* 171* 98  BUN 13 12 25*  CREATININE 0.84 0.97 1.54*  CALCIUM 9.1 8.8* 8.7*   Hepatic Function Latest Ref Rng & Units 06/22/2015 06/04/2014 05/30/2013  Total Protein 6.0 - 8.3 g/dL 6.7 - 7.3  Albumin 3.5 - 5.2 g/dL 3.6 4.0 3.9  AST 0 - 37 U/L _0 ALT 0 - 35 U/L _1 Alk Phosphatase 39 - 117 U/L 71 78 67  Total Bilirubin 0.2 - 1.2 mg/dL 0.5 0.6 0.7  Bilirubin, Direct 0.0 - 0.3 mg/dL 0.1 0.17 0.1    Lipid Panel     Component Value Date/Time   CHOL 157 06/22/2015 0852   TRIG 81.0 06/22/2015 0852   TRIG 45 05/03/2006 0803   HDL 50.80 06/22/2015 0852   CHOLHDL 3 06/22/2015 0852   VLDL 16.2 06/22/2015 0852   LDLCALC 90 06/22/2015 0852   LDLDIRECT 84.4 05/16/2011 0803   Studies/Results:  ECHO Study Conclusions  - Left ventricle: The cavity size was normal. Wall thickness was   increased in a pattern of moderate LVH. Systolic function was   vigorous. The estimated ejection fraction was in the range of 65%   to 70%. Wall motion was normal; there were no regional wall   motion abnormalities. Doppler parameters are consistent with   abnormal left ventricular  relaxation (grade 1 diastolic   dysfunction). - Pulmonary arteries: PA peak pressure: 40 mm Hg (S). Right ventricle:  The cavity size was normal. Wall thickness was normal. Systolic function was normal. Right atrium:  The atrium was normal in size.    CT CHEST, ABDOMEN AND PELVIS IMPRESSION: 1. No evidence of pulmonary embolism. 2. Pneumonia involving the lower lobes bilaterally. Dense airspace consolidation in the upper posterior right lower lobe accounts for the chest x-ray findings earlier today. 3. Small bilateral pleural effusions. 4. Partial small bowel obstruction with the transition to normal caliber small bowel in the upper pelvis. 5. Possible internal hernia as there are numerous small bowel loops lateral to the descending colon. 6. Small hiatal hernia. Possible stricture at the EG junction. Oral contrast material throughout the mildly distended esophagus likely indicates reflux and/or esophageal dysmotility. 7. Small amount of ascites dependently in the pelvis. 8. Generalized atherosclerosis. 9. Multinodular goiter.   Assessment/Plan   Active Problems:   Ventral hernia without obstruction or gangrene   Tachycardia with 121 - 140 beats per minute   1. Persistent tachycardia:  In the setting of recent surgery/ pain post surgery, Elevated D-dimer of 19 and suppressed TSH.  No CP nor palpitations. + for mild dyspnea.  2D echo shows vigorous LV systolic function with EF of 65-70%. Normal RA size and RV function.  No PE on CT but bilateral airspace consolidation ? c/w pneumonia.  P 112 - 115 range; metoprolol changed to iv dosing with bowel obstruction and oral absorption difficulty;  Will hold nifedipine. Will place on telemetry.  2. TSH mildly low: TSH 0.277, but free T4 nl and low free T3; doubt hyperthyroidism .  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 Consider endo eval.                      3. Ventral hernia s/p repair:  Partial small bowel obstruction on CT  4.  AKI:  Cr 0.84 --> 1.54 probably contributed by contrast load; dc lisinopril and hydrate   5. HTN: stable in 100 - 114 range  6. HLD: on zocor  Will f/u LFTs.   7. DM II: on Amaryl and metformin at home  8. Lung consolidation: Afebrile. Consider sputum eval. Pulmonary toilet.   Troy Sine, MD, [facility] 02/25/2016 11:10 AM

## 2016-02-26 ENCOUNTER — Inpatient Hospital Stay (HOSPITAL_COMMUNITY): Payer: Medicare Other

## 2016-02-26 DIAGNOSIS — J189 Pneumonia, unspecified organism: Secondary | ICD-10-CM

## 2016-02-26 LAB — CBC WITH DIFFERENTIAL/PLATELET
BASOS PCT: 1 %
Basophils Absolute: 0.1 10*3/uL (ref 0.0–0.1)
EOS ABS: 0.2 10*3/uL (ref 0.0–0.7)
Eosinophils Relative: 2 %
HCT: 35.4 % — ABNORMAL LOW (ref 36.0–46.0)
Hemoglobin: 11.5 g/dL — ABNORMAL LOW (ref 12.0–15.0)
Lymphocytes Relative: 10 %
Lymphs Abs: 0.9 10*3/uL (ref 0.7–4.0)
MCH: 25.4 pg — AB (ref 26.0–34.0)
MCHC: 32.5 g/dL (ref 30.0–36.0)
MCV: 78.3 fL (ref 78.0–100.0)
MONO ABS: 2.1 10*3/uL — AB (ref 0.1–1.0)
Monocytes Relative: 22 %
NEUTROS ABS: 6.1 10*3/uL (ref 1.7–7.7)
Neutrophils Relative %: 65 %
PLATELETS: 297 10*3/uL (ref 150–400)
RBC: 4.52 MIL/uL (ref 3.87–5.11)
RDW: 14.9 % (ref 11.5–15.5)
WBC Morphology: INCREASED
WBC: 9.4 10*3/uL (ref 4.0–10.5)

## 2016-02-26 LAB — GLUCOSE, CAPILLARY
GLUCOSE-CAPILLARY: 109 mg/dL — AB (ref 65–99)
Glucose-Capillary: 125 mg/dL — ABNORMAL HIGH (ref 65–99)
Glucose-Capillary: 138 mg/dL — ABNORMAL HIGH (ref 65–99)
Glucose-Capillary: 140 mg/dL — ABNORMAL HIGH (ref 65–99)
Glucose-Capillary: 160 mg/dL — ABNORMAL HIGH (ref 65–99)

## 2016-02-26 LAB — BASIC METABOLIC PANEL
Anion gap: 14 (ref 5–15)
BUN: 27 mg/dL — AB (ref 6–20)
CHLORIDE: 111 mmol/L (ref 101–111)
CO2: 16 mmol/L — ABNORMAL LOW (ref 22–32)
CREATININE: 1.18 mg/dL — AB (ref 0.44–1.00)
Calcium: 8.5 mg/dL — ABNORMAL LOW (ref 8.9–10.3)
GFR calc Af Amer: 52 mL/min — ABNORMAL LOW (ref >60)
GFR calc non Af Amer: 45 mL/min — ABNORMAL LOW (ref >60)
GLUCOSE: 110 mg/dL — AB (ref 65–99)
Potassium: 5 mmol/L (ref 3.5–5.1)
SODIUM: 141 mmol/L (ref 135–145)

## 2016-02-26 LAB — PHOSPHORUS: Phosphorus: 3.1 mg/dL (ref 2.5–4.6)

## 2016-02-26 LAB — MAGNESIUM: MAGNESIUM: 2.5 mg/dL — AB (ref 1.7–2.4)

## 2016-02-26 LAB — PROCALCITONIN: Procalcitonin: 0.48 ng/mL

## 2016-02-26 MED ORDER — SODIUM CHLORIDE 0.9% FLUSH
10.0000 mL | Freq: Two times a day (BID) | INTRAVENOUS | Status: DC
  Administered 2016-02-26 – 2016-02-29 (×2): 10 mL

## 2016-02-26 MED ORDER — VANCOMYCIN HCL IN DEXTROSE 750-5 MG/150ML-% IV SOLN
750.0000 mg | Freq: Two times a day (BID) | INTRAVENOUS | Status: DC
  Administered 2016-02-27 – 2016-02-29 (×5): 750 mg via INTRAVENOUS
  Filled 2016-02-26 (×6): qty 150

## 2016-02-26 MED ORDER — M.V.I. ADULT IV INJ
INTRAVENOUS | Status: AC
  Administered 2016-02-26: 17:00:00 via INTRAVENOUS
  Filled 2016-02-26: qty 960

## 2016-02-26 MED ORDER — SODIUM CHLORIDE 0.9% FLUSH
10.0000 mL | INTRAVENOUS | Status: DC | PRN
  Administered 2016-02-27 – 2016-03-03 (×3): 10 mL
  Filled 2016-02-26 (×3): qty 40

## 2016-02-26 MED ORDER — VANCOMYCIN HCL IN DEXTROSE 1-5 GM/200ML-% IV SOLN
1000.0000 mg | Freq: Once | INTRAVENOUS | Status: AC
  Administered 2016-02-26: 1000 mg via INTRAVENOUS
  Filled 2016-02-26: qty 200

## 2016-02-26 MED ORDER — METOPROLOL TARTRATE 5 MG/5ML IV SOLN
5.0000 mg | INTRAVENOUS | Status: DC
  Administered 2016-02-26 – 2016-03-01 (×23): 5 mg via INTRAVENOUS
  Filled 2016-02-26 (×24): qty 5

## 2016-02-26 MED ORDER — PIPERACILLIN-TAZOBACTAM 3.375 G IVPB
3.3750 g | Freq: Three times a day (TID) | INTRAVENOUS | Status: DC
  Administered 2016-02-26 – 2016-02-29 (×8): 3.375 g via INTRAVENOUS
  Filled 2016-02-26 (×11): qty 50

## 2016-02-26 MED ORDER — FAT EMULSION 20 % IV EMUL
240.0000 mL | INTRAVENOUS | Status: AC
  Administered 2016-02-26: 240 mL via INTRAVENOUS
  Filled 2016-02-26: qty 250

## 2016-02-26 MED ORDER — SODIUM CHLORIDE 0.9 % IV SOLN: INTRAVENOUS | Status: AC

## 2016-02-26 MED ORDER — SODIUM CHLORIDE 0.9 % IV SOLN
INTRAVENOUS | Status: DC
  Administered 2016-02-27 – 2016-02-28 (×2): via INTRAVENOUS

## 2016-02-26 NOTE — Progress Notes (Addendum)
PARENTERAL NUTRITION CONSULT NOTE - INITIAL  Pharmacy Consult for TPN Indication: Prolonged Ileus  Allergies  Allergen Reactions  . No Known Allergies     Patient Measurements: Height: 5\' 5"  (165.1 cm) Weight: 160 lb (72.6 kg) IBW/kg (Calculated) : 57 Adjusted Body Weight: 61.7 kg  Vital Signs: Temp: 98.1 F (36.7 C) (08/04 0700) Temp Source: Oral (08/04 0700) BP: 123/59 (08/04 0700) Pulse Rate: 102 (08/04 0700) Intake/Output from previous day: 08/03 0701 - 08/04 0700 In: 2855 [P.O.:180; I.V.:2675] Out: 1950 [Urine:800; Emesis/NG output:1150] Intake/Output from this shift: No intake/output data recorded.  Labs:  Recent Labs  02/24/16 1045 02/26/16 0729  WBC 6.5 9.4  HGB 12.0 11.5*  HCT 36.1 35.4*  PLT 369 297     Recent Labs  02/24/16 0508 02/25/16 0229 02/26/16 0413  NA 135 135 141  K 4.9 5.4* 5.0  CL 107 108 111  CO2 20* 20* 16*  GLUCOSE 171* 98 110*  BUN 12 25* 27*  CREATININE 0.97 1.54* 1.18*  CALCIUM 8.8* 8.7* 8.5*  MG 1.2* 2.0  --    Estimated Creatinine Clearance: 43 mL/min (by C-G formula based on SCr of 1.18 mg/dL).    Recent Labs  02/25/16 2346 02/26/16 0357 02/26/16 0856  GLUCAP 115* 109* 125*    Medical History: Past Medical History:  Diagnosis Date  . Arthritis    hands, feet, ? type of arthritis, no treatment yet   . BACK PAIN, LUMBAR 05/21/2009   pt. reports bulging disc in lumbar region, since 2015  . Cataracts, bilateral   . 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 DIABETES MELLITUS, TYPE II 04/19/2007  . DIVERTICULOSIS, COLON 07/20/2006  . GASTROESOPHAGEAL REFLUX DISEASE 08/21/2007  . HIP PAIN 05/24/2010  . HYPERLIPIDEMIA 04/19/2007  . HYPERTENSION 04/19/2007  . MENOPAUSAL SYNDROME 04/19/2007  . OSTEOPOROSIS 04/19/2007    Medications:  Scheduled:  . heparin  5,000 Units Subcutaneous Q8H  . insulin aspart  0-9 Units Subcutaneous Q4H  . metoprolol  5 mg Intravenous Q6H   Infusions:  . sodium chloride 125 mL/hr at  02/26/16 0726    Insulin Requirements in the past 24 hours:  1 unit of sensitive SSI every 4 hours  Current Nutrition:  NPO (Patient was on carb modified diet from 7/26 to 7/29, then clears until 8/2, now NPO)  Assessment: 75 year old female s/p laparoscopic ventral hernia repair with mesh insertion on 7/26 now 9 days post-op with ileus and now mid-distal SBO without perforation on abdominal film without response to bowel rest since 8/2 and NG tube to LIWS to have PICC inserted and start TPN on 8/4.   Surgeries/Procedures: 7/26 Laparoscopic ventral hernia repair with mesh insertion  GI: Ileus/SBO on Abd film. NG tube -1800 cc output yesterday. Tolerated 4 cups of ice and 1 spontaneous BM yesterday.  Endo: CBGs 92 to 134 on sensitive SSI. Multinodular thyproid -Low TSH but normal T4/Low T3, to be followed outpt.  Lytes: Hyperkalemia resolving - K 5 today, Mg 2 on 8/2, CoCa  IV Fluids - NS @ 125 cc/hr.  Renal: SCr trending down 1.18, UOP 0.5 cc/kg/hr.  Pulm: Placed back on O2 today at 2L [place].  Cards: Lopressor IV q6h - ST (episode of SVT 8/3 PM). BP ok. 8/2 TTE-EF 65%, PAP 40.  Hepatobil: No labs this admission- follow-up in AM. Prior labs within normal limits. Triglycerides wnl in prior labs- none this admission.  Neuro: AA&O. Pain score 0. ID:  WBC wnl, afebrile.  Best Practices: SQ heparin  TPN Access: PICC 8/4 >> TPN start date: 8/4 >>   Nutritional Goals: F/up with RD for goals  kCal,  grams of protein per day  Plan:  Initiate Clinimix 5/15 at 66ml/hr at 1800 PM and follow-up tolerance- no electrolytes in bag due elevated K. Provides ~48g protein and ~1160 kcal.  Initiate 20% Lipid emulsion @ 10 ml/hr for 24 hours. No trace elements until observe labs in AM.  Add MVI to TPN bag.  Reduce IV fluids to 85 mL/hr at 1800 with start of TPN.  Continue sensitive SSI every 4 hours.  Follow-up Phos and Mg for replacement outside bag today if needed.  Follow-up TPN labs in AM.    Sloan Leiter, PharmD, BCPS Clinical Pharmacist [phone] 02/26/2016,10:02 AM  Addendum: Phosphorus wnl at 3.1 and Magnesium elevated at 2.5 on lab check today.  No replacement needed - continue without electrolytes in TPN.  Follow-up AM labs.   Sloan Leiter, PharmD, BCPS Clinical Pharmacist [phone] 02/26/2016, 12:18 PM

## 2016-02-26 NOTE — Progress Notes (Signed)
Patient Name: Lindsey Jenkins Date of Encounter: 02/26/2016  Active Problems:   Ventral hernia without obstruction or gangrene   Tachycardia with 121 - 140 beats per minute    Patient Profile: 75 yo female with PMH of HTN, HLD, and DMII, but no past cardiac history presented with ventral hernia repair, cardiology consulted post op for persistent tachycardia. D-dimer elevated at 19.32. TSH also suppressed at 0.277.   SUBJECTIVE: Has pain in abdomen. Denies chest pain. Does feel SOB.   OBJECTIVE Vitals:   02/25/16 1430 02/25/16 1957 02/26/16 0700 02/26/16 1145  BP: 134/77 121/66 (!) 123/59 110/67  Pulse: (!) 121 (!) 122 (!) 102 (!) 116  Resp: (!) 23 19 19    Temp: 98.1 F (36.7 C) 98 F (36.7 C) 98.1 F (36.7 C)   TempSrc: Oral Oral Oral   SpO2: 91% 94% 94% 96%  Weight:      Height:        Filed Weights   02/17/16 0639 02/21/16 1800  Weight: 160 lb (72.6 kg) 160 lb (72.6 kg)    PHYSICAL EXAM General: Well developed, well nourished, female in no acute distress. Head: Normocephalic, atraumatic.  Neck: Supple without bruits, no JVD. Lungs:  Resp regular and unlabored, CTA. Heart: RRR, S1, S2, no S3, S4, or murmur; no rub. Abdomen: Soft, non-tender, non-distended, BS + x 4.  Extremities: No clubbing, cyanosis,no edema.  Neuro: Alert and oriented X 3. Moves all extremities spontaneously. Psych: Normal affect.  LABS: CBC: Recent Labs  02/24/16 1045 02/26/16 0729  WBC 6.5 9.4  NEUTROABS  --  6.1  HGB 12.0 11.5*  HCT 36.1 35.4*  MCV 78.3 78.3  PLT 369 [id number]   Basic Metabolic Panel: Recent Labs  02/25/16 0229 02/26/16 0413 02/26/16 1125  NA 135 141  --   K 5.4* 5.0  --   CL 108 111  --   CO2 20* 16*  --   GLUCOSE 98 110*  --   BUN 25* 27*  --   CREATININE 1.54* 1.18*  --   CALCIUM 8.7* 8.5*  --   MG 2.0  --  2.5*  PHOS  --   --  3.1   D-dimer: Recent Labs  02/24/16 0508  DDIMER 19.32*   Thyroid Function Tests: Recent Labs  02/24/16 0508  02/24/16 1527  TSH 0.277*  --   T3FREE  --  1.4*    Current Facility-Administered Medications:  .  0.9 %  sodium chloride infusion, , Intravenous, Continuous **FOLLOWED BY** 0.9 %  sodium chloride infusion, , Intravenous, Continuous, Autumn Messing III, MD .  TPN [facility]) Adult without lytes, , Intravenous, Continuous TPN **AND** fat emulsion 20 % infusion 240 mL, 240 mL, Intravenous, Continuous TPN, Ralene Ok, MD .  heparin injection 5,000 Units, 5,000 Units, Subcutaneous, Q8H, Autumn Messing III, MD, 5,000 Units at 02/26/16 1428 .  insulin aspart (novoLOG) injection 0-9 Units, 0-9 Units, Subcutaneous, Q4H, Greer Pickerel, MD, 1 Units at 02/26/16 1256 .  menthol-cetylpyridinium (CEPACOL) lozenge 3 mg, 1 lozenge, Oral, PRN, Autumn Messing III, MD, 3 mg at 02/18/16 [phone] .  methocarbamol (ROBAXIN) tablet 500 mg, 500 mg, Oral, Q6H PRN, Autumn Messing III, MD, 500 mg at 02/18/16 0109 .  metoprolol (LOPRESSOR) injection 5 mg, 5 mg, Intravenous, Q6H, Greer Pickerel, MD, 5 mg at 02/26/16 1147 .  morphine 2 MG/ML injection 1-4 mg, 1-4 mg, Intravenous, Q1H PRN, Autumn Messing III, MD, 2 mg at 02/26/16 [phone] .  ondansetron (ZOFRAN-ODT) disintegrating  tablet 4 mg, 4 mg, Oral, Q6H PRN **OR** ondansetron (ZOFRAN) injection 4 mg, 4 mg, Intravenous, Q6H PRN, Autumn Messing III, MD, 4 mg at 02/24/16 1137 .  piperacillin-tazobactam (ZOSYN) IVPB 3.375 g, 3.375 g, Intravenous, Q8H, Praveen Mannam, MD, 3.375 g at 02/26/16 1416 .  sodium chloride flush (NS) 0.9 % injection 10-40 mL, 10-40 mL, Intracatheter, Q12H, Autumn Messing III, MD, 10 mL at 02/26/16 1257 .  sodium chloride flush (NS) 0.9 % injection 10-40 mL, 10-40 mL, Intracatheter, PRN, Autumn Messing III, MD .  vancomycin (VANCOCIN) IVPB 1000 mg/200 mL premix, 1,000 mg, Intravenous, Once, 1,000 mg at 02/26/16 1422 **FOLLOWED BY** [START ON 02/27/2016] vancomycin (VANCOCIN) IVPB 750 mg/150 ml premix, 750 mg, Intravenous, Q12H, Praveen Mannam, MD . sodium chloride     Followed by  . sodium chloride     . TPN (CLINIMIX) Adult without lytes     And  . fat emulsion     TELE:  Sinus tach 110-120's.        ECHO Study Conclusions  - Left ventricle: The cavity size was normal. Wall thickness was increased in a pattern of moderate LVH. Systolic function was vigorous. The estimated ejection fraction was in the range of 65% to 70%. Wall motion was normal; there were no regional wall motion abnormalities. Doppler parameters are consistent with abnormal left ventricular relaxation (grade 1 diastolic dysfunction). - Pulmonary arteries: PA peak pressure: 40 mm Hg (S). Right ventricle: The cavity size was normal. Wall thickness was normal. Systolic function was normal. Right atrium: The atrium was normal in size.  Radiology/Studies: Ct Angio Chest Pe W Or Wo Contrast  Result Date: 02/24/2016 CLINICAL DATA:  Abnormal chest x-ray earlier today questioning a right perihilar mass. Elevated D-dimer and tachycardia. Ventral hernia repair with mesh on 02/17/2016. EXAM: CT ANGIOGRAPHY CHEST CT ABDOMEN AND PELVIS WITH CONTRAST TECHNIQUE: Multidetector CT imaging of the chest was performed using the standard protocol during bolus administration of intravenous contrast. Multiplanar CT image reconstructions and MIPs were obtained to evaluate the vascular anatomy. Multidetector CT imaging of the abdomen and pelvis was performed using the standard protocol during bolus administration of intravenous contrast. CONTRAST:  100 ml Isovue 370 IV. Oral contrast was also administered. COMPARISON:  No prior CT. Acute abdomen series earlier today is correlated. FINDINGS: CTA CHEST FINDINGS Cardiovascular: Contrast opacification of the pulmonary arteries is good. Respiratory motion blurs images throughout the chest. Overall, the study is of moderate to good diagnostic quality. No filling defects identified in either main pulmonary artery or their central branches in either lung to suggest pulmonary embolism.  Heart mildly enlarged. No pericardial effusion. Mild atherosclerosis involving a diagonal branch of the LAD. Mild to moderate atherosclerosis involving the thoracic and upper abdominal aorta without evidence of aneurysm. Mediastinum/Lymph nodes: No mediastinal mass. No pathologic lymphadenopathy. Oral contrast material within a dilated esophagus throughout. Small hiatal hernia with possible stricture at the EG junction. Multiple nodules involving both lobes of the thyroid gland. Lungs/Pleura: Peripheral dense consolidation in the posterior superior right lower lobe and dense consolidation in the deep posterior inferior right lower lobe. Dense consolidation to a lesser degree in the deep posterior inferior left lower lobe. Small bilateral pleural effusions. No evidence of interstitial lung disease. Central airways patent with isolated bronchial wall thickening involving the right lower lobe bronchi. Musculoskeletal: Degenerative disc disease and spondylosis throughout the thoracic spine. No acute abnormalities. CT ABDOMEN and PELVIS FINDINGS Hepatobiliary: Liver normal in size and appearance. Gallbladder contracted without evidence of  cholelithiasis. Pancreas: Mildly atrophic without focal parenchymal abnormality. Spleen: Normal in size and appearance. Adrenals/Urinary tract: Normal appearing adrenal glands. Kidneys normal in size and appearance without focal parenchymal abnormality. No evidence of urinary tract calculi or obstruction. Normal-appearing urinary bladder. Stomach/Bowel: Small hiatal hernia as mentioned above. Moderate gastric distention. Numerous dilated, fluid-filled loops of small bowel in the abdomen and pelvis, with a transition to more normal caliber small bowel in the upper pelvis. Distal small bowel decompressed. Entire colon decompressed. Several small bowel loops in the left side of the abdomen are position lateral to the descending colon. Scattered sigmoid colon diverticula without evidence of  acute diverticulitis. Normal appendix in the right mid abdomen and right upper pelvis. Vascular/Lymphatic: Moderate to severe aortoiliac atherosclerosis without aneurysm. Normal-appearing portal venous and systemic venous systems. No pathologic lymphadenopathy. Reproductive: Uterus atrophic.  No adnexal masses. Other: Small amount of ascites dependently in the pelvis. Diffuse body wall edema. Musculoskeletal: Multilevel degenerative disc disease, spondylosis and facet degenerative changes involving the lumbar spine, worst at L5-S1. No acute abnormalities. Review of the MIP images confirms the above findings. IMPRESSION: 1. No evidence of pulmonary embolism. 2. Pneumonia involving the lower lobes bilaterally. Dense airspace consolidation in the upper posterior right lower lobe accounts for the chest x-ray findings earlier today. 3. Small bilateral pleural effusions. 4. Partial small bowel obstruction with the transition to normal caliber small bowel in the upper pelvis. 5. Possible internal hernia as there are numerous small bowel loops lateral to the descending colon. 6. Small hiatal hernia. Possible stricture at the EG junction. Oral contrast material throughout the mildly distended esophagus likely indicates reflux and/or esophageal dysmotility. 7. Small amount of ascites dependently in the pelvis. 8. Generalized atherosclerosis. 9. Multinodular goiter. Electronically Signed   By: Evangeline Dakin M.D.   On: 02/24/2016 17:13   Ct Abdomen Pelvis W Contrast  Result Date: 02/24/2016 CLINICAL DATA:  Abnormal chest x-ray earlier today questioning a right perihilar mass. Elevated D-dimer and tachycardia. Ventral hernia repair with mesh on 02/17/2016. EXAM: CT ANGIOGRAPHY CHEST CT ABDOMEN AND PELVIS WITH CONTRAST TECHNIQUE: Multidetector CT imaging of the chest was performed using the standard protocol during bolus administration of intravenous contrast. Multiplanar CT image reconstructions and MIPs were obtained to  evaluate the vascular anatomy. Multidetector CT imaging of the abdomen and pelvis was performed using the standard protocol during bolus administration of intravenous contrast. CONTRAST:  100 ml Isovue 370 IV. Oral contrast was also administered. COMPARISON:  No prior CT. Acute abdomen series earlier today is correlated. FINDINGS: CTA CHEST FINDINGS Cardiovascular: Contrast opacification of the pulmonary arteries is good. Respiratory motion blurs images throughout the chest. Overall, the study is of moderate to good diagnostic quality. No filling defects identified in either main pulmonary artery or their central branches in either lung to suggest pulmonary embolism. Heart mildly enlarged. No pericardial effusion. Mild atherosclerosis involving a diagonal branch of the LAD. Mild to moderate atherosclerosis involving the thoracic and upper abdominal aorta without evidence of aneurysm. Mediastinum/Lymph nodes: No mediastinal mass. No pathologic lymphadenopathy. Oral contrast material within a dilated esophagus throughout. Small hiatal hernia with possible stricture at the EG junction. Multiple nodules involving both lobes of the thyroid gland. Lungs/Pleura: Peripheral dense consolidation in the posterior superior right lower lobe and dense consolidation in the deep posterior inferior right lower lobe. Dense consolidation to a lesser degree in the deep posterior inferior left lower lobe. Small bilateral pleural effusions. No evidence of interstitial lung disease. Central airways patent with isolated  bronchial wall thickening involving the right lower lobe bronchi. Musculoskeletal: Degenerative disc disease and spondylosis throughout the thoracic spine. No acute abnormalities. CT ABDOMEN and PELVIS FINDINGS Hepatobiliary: Liver normal in size and appearance. Gallbladder contracted without evidence of cholelithiasis. Pancreas: Mildly atrophic without focal parenchymal abnormality. Spleen: Normal in size and appearance.  Adrenals/Urinary tract: Normal appearing adrenal glands. Kidneys normal in size and appearance without focal parenchymal abnormality. No evidence of urinary tract calculi or obstruction. Normal-appearing urinary bladder. Stomach/Bowel: Small hiatal hernia as mentioned above. Moderate gastric distention. Numerous dilated, fluid-filled loops of small bowel in the abdomen and pelvis, with a transition to more normal caliber small bowel in the upper pelvis. Distal small bowel decompressed. Entire colon decompressed. Several small bowel loops in the left side of the abdomen are position lateral to the descending colon. Scattered sigmoid colon diverticula without evidence of acute diverticulitis. Normal appendix in the right mid abdomen and right upper pelvis. Vascular/Lymphatic: Moderate to severe aortoiliac atherosclerosis without aneurysm. Normal-appearing portal venous and systemic venous systems. No pathologic lymphadenopathy. Reproductive: Uterus atrophic.  No adnexal masses. Other: Small amount of ascites dependently in the pelvis. Diffuse body wall edema. Musculoskeletal: Multilevel degenerative disc disease, spondylosis and facet degenerative changes involving the lumbar spine, worst at L5-S1. No acute abnormalities. Review of the MIP images confirms the above findings. IMPRESSION: 1. No evidence of pulmonary embolism. 2. Pneumonia involving the lower lobes bilaterally. Dense airspace consolidation in the upper posterior right lower lobe accounts for the chest x-ray findings earlier today. 3. Small bilateral pleural effusions. 4. Partial small bowel obstruction with the transition to normal caliber small bowel in the upper pelvis. 5. Possible internal hernia as there are numerous small bowel loops lateral to the descending colon. 6. Small hiatal hernia. Possible stricture at the EG junction. Oral contrast material throughout the mildly distended esophagus likely indicates reflux and/or esophageal dysmotility. 7.  Small amount of ascites dependently in the pelvis. 8. Generalized atherosclerosis. 9. Multinodular goiter. Electronically Signed   By: Evangeline Dakin M.D.   On: 02/24/2016 17:13   Dg Abd Acute W/chest  Result Date: 02/26/2016 CLINICAL DATA:  Nine days postoperative from laparoscopic ventral hernia repair. Patient reports abdominal distention and increased pain over the past several days. Patient has had 2 bowel movements ; noisy breathing, productive cough. EXAM: DG ABDOMEN ACUTE W/ 1V CHEST COMPARISON:  CT scan of the chest of February 24, 2016 FINDINGS: There is persistent infiltrate in the inferior aspect of the right upper lobe. There small bilateral pleural effusions. There is bibasilar atelectasis or pneumonia. The heart and pulmonary vascularity are normal. There is calcification in the wall of the aortic arch. There is an esophagogastric tube present whose tip lies in the gastric cardia. Within the abdomen there are moderately distended gas-filled loops of small bowel stacked in the midline. There is a small amount of stool and gas in the right colon. There is no rectal gas. No free extraluminal gas collections are observed. The bony structures exhibit no acute abnormalities. There degenerative changes of the lumbar spine with gentle dextrocurvature. IMPRESSION: 1. Inferior right upper lobe pneumonia, bibasilar atelectasis and trace pleural effusions, stable. 2. Mid to distal small bowel obstruction without evidence of perforation. Electronically Signed   By: David  Martinique M.D.   On: 02/26/2016 08:46     Current Medications:  . heparin  5,000 Units Subcutaneous Q8H  . insulin aspart  0-9 Units Subcutaneous Q4H  . metoprolol  5 mg Intravenous Q6H  . piperacillin-tazobactam (  ZOSYN)  IV  3.375 g Intravenous Q8H  . sodium chloride flush  10-40 mL Intracatheter Q12H  . vancomycin  1,000 mg Intravenous Once   Followed by  . [START ON 02/27/2016] vancomycin  750 mg Intravenous Q12H   . sodium chloride      Followed by  . sodium chloride    . TPN (CLINIMIX) Adult without lytes     And  . fat emulsion      ASSESSMENT AND PLAN: Active Problems:   Ventral hernia without obstruction or gangrene   Tachycardia with 121 - 140 beats per minute  1. Persistent tachycardia:  In the setting of recent surgery/ pain post surgery, Elevated D-dimer of 19 and suppressed TSH.  No CP nor palpitations. + for mild dyspnea.  2D echo shows vigorous LV systolic function with EF of 65-70%. Normal RA size and RV function.  No PE on CT but bilateral airspace consolidation ? c/w pneumonia.  Metoprolol changed to iv dosing with bowel obstruction and oral absorption difficulty;  Will hold nifedipine.   HR remains elevated in 110-120 range. Would increase metoprolol to 10mg  IV q 4 hours. She is in some pain as well, definitely could be contributing.   2. TSH mildly low: TSH 0.277, but free T4 nl and low free T3; doubt hyperthyroidism .  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 Consider endo eval.                                                       3. Ventral hernia s/p repair:  Partial small bowel obstruction on CT  4. AKI:  Cr 0.84 --> 1.18 probably contributed by contrast load; dc lisinopril and hydrate   5. HTN: stable in 100 - 114 range  6. HLD: on zocor    7. DM II: on Amaryl and metformin at home  8. Lung consolidation: Afebrile. Consider sputum eval. Pulmonary toilet.  Signed, Arbutus Leas , NP 2:44 PM 02/26/2016 Pager [phone]  Patient seen and examined. Agree with assessment and plan. To start on antibiotic Rx with vanc/zosyn initially for HCAP. Remains tachycardic; will change metoprolol to 5 mg q 4 hrs from q 6 hrs. Cr improved today from 1.54 yesterday to 1.18, lisinopril on hold.   Troy Sine, MD, [facility] 02/26/2016 5:58 PM

## 2016-02-26 NOTE — Progress Notes (Signed)
Peripherally Inserted Central Catheter/Midline Placement  The IV Nurse has discussed with the patient and/or persons authorized to consent for the patient, the purpose of this procedure and the potential benefits and risks involved with this procedure.  The benefits include less needle sticks, lab draws from the catheter and patient may be discharged home with the catheter.  Risks include, but not limited to, infection, bleeding, blood clot (thrombus formation), and puncture of an artery; nerve damage and irregular heat beat.  Alternatives to this procedure were also discussed.  PICC/Midline Placement Documentation        Lindsey Jenkins 02/26/2016, 11:09 AM

## 2016-02-26 NOTE — Progress Notes (Signed)
Occupational Therapy Treatment Patient Details Name: Lindsey Jenkins MRN: [id number] DOB: Aug 14, 1940 Today's Date: 02/26/2016    History of present illness Pt is a 75 y/o female s/p ventral hernis repair. Hospital course complicated by ileus. PMH including but not limited to DM and HTN.   OT comments  Pt with decreased activity tolerance, now with PNA and ileus and NPO. Pt still willing to ambulate and participate in ADL with therapies. Maintained 02 sats 92-94% on 2L 02, performed incentive spirometer. HR to 133, closely monitored throughout.  Follow Up Recommendations  No OT follow up    Equipment Recommendations       Recommendations for Other Services      Precautions / Restrictions Precautions Precautions: Fall Precaution Comments: abdominal binder Restrictions Weight Bearing Restrictions: No       Mobility Bed Mobility Overal bed mobility: Needs Assistance Bed Mobility: Supine to Sit     Supine to sit: Min guard     General bed mobility comments: Pt required cues for hand placement and slow to transition, Labored breathing noted edge of bed with patient on 2L of O2 at this time.    Transfers Overall transfer level: Needs assistance Equipment used: Rolling walker (2 wheeled) Transfers: Sit to/from Stand Sit to Stand: Min guard         General transfer comment: Pt performed with min guard for safety, no assist needed.      Balance     Sitting balance-Leahy Scale: Good       Standing balance-Leahy Scale: Fair                     ADL Overall ADL's : Needs assistance/impaired     Grooming: Oral care;Sitting;Set up           Upper Body Dressing : Maximal assistance;Sitting (due to multiple lines)                   Functional mobility during ADLs: Min guard;Rolling walker;+2 for safety/equipment        Vision                     Perception     Praxis      Cognition   Behavior During Therapy: [facility] for tasks  assessed/performed Overall Cognitive Status: Within Functional Limits for tasks assessed                       Extremity/Trunk Assessment               Exercises Other Exercises Other Exercises: Pt performed Incentive spirometer 1x10 reps avg 750 ml.  Pt educated to perform 1x10 every hour.  Pt agreeable and demonstrates good technique after corrections.     Shoulder Instructions       General Comments      Pertinent Vitals/ Pain       Pain Assessment: Faces Faces Pain Scale: No hurt  Home Living                                          Prior Functioning/Environment              Frequency Min 2X/week     Progress Toward Goals  OT Goals(current goals can now be found in the care plan section)  Progress towards OT goals: Not progressing toward  goals - comment (pt now with PNA and ileus)  Acute Rehab OT Goals Patient Stated Goal: return home Time For Goal Achievement: 03/01/16 Potential to Achieve Goals: Good  Plan Discharge plan remains appropriate    Co-evaluation    PT/OT/SLP Co-Evaluation/Treatment: Yes Reason for Co-Treatment: Complexity of the patient's impairments (multi-system involvement) (pt weak, now requiring 02)   OT goals addressed during session: ADL's and self-care      End of Session Equipment Utilized During Treatment: Gait belt;Rolling walker;Oxygen   Activity Tolerance Patient limited by fatigue;Treatment limited secondary to medical complications (Comment) (elevated HR)   Patient Left in chair;with call bell/phone within reach;with family/visitor present   Nurse Communication Mobility status        Time: [id number] OT Time Calculation (min): 19 min  Charges: OT General Charges $OT Visit: 1 Procedure OT Treatments $Self Care/Home Management : 8-22 mins  Malka So 02/26/2016, 4:55 PM  [phone]

## 2016-02-26 NOTE — Progress Notes (Signed)
Pharmacy Antibiotic Note  Lindsey Jenkins is a 75 y.o. female admitted on 02/17/2016 with pneumonia.  Pharmacy has been consulted for Zosyn and vancomycin dosing. CTA on 8/2 consistent with consolidation of the RLL. Blood and sputum cultures are pending. Patient had emesis and ileus and there is concern for aspiration but treating broad as HCAP for now in setting of respiratory insufficiency and tachycardia POD#9. WBC remains normal and afebrile at this time.   Plan: Vancomycin 1000mg  IV now then 750mg   IV every 12 hours.  Goal trough 15-20 mcg/mL. Zosyn 3.375g IV q8h (4 hour infusion).  Monitor renal function, culture results, and clinical status.  Follow-up ability to narrow antibiotics.   Height: 5\' 5"  (165.1 cm) Weight: 160 lb (72.6 kg) IBW/kg (Calculated) : 57  Temp (24hrs), Avg:98.1 F (36.7 C), Min:98 F (36.7 C), Max:98.1 F (36.7 C)   Recent Labs Lab 02/22/16 0435 02/23/16 0413 02/24/16 0508 02/24/16 1045 02/25/16 0229 02/26/16 0413 02/26/16 0729  WBC 4.8  --   --  6.5  --   --  9.4  CREATININE 1.05* 0.84 0.97  --  1.54* 1.18*  --     Estimated Creatinine Clearance: 43 mL/min (by C-G formula based on SCr of 1.18 mg/dL).    Allergies  Allergen Reactions  . No Known Allergies     Antimicrobials this admission: 8/4 Vanc >> 8/4 Zosyn >>  Dose adjustments this admission: n/a  Microbiology results: 8/4 BCx:  8/4 Sputum:   Thank you for allowing pharmacy to be a part of this patient's care.  Sloan Leiter, PharmD, BCPS Clinical Pharmacist [phone] 02/26/2016 12:23 PM

## 2016-02-26 NOTE — Consult Note (Addendum)
PULMONARY / CRITICAL CARE MEDICINE   Name: Lindsey Jenkins MRN: [id number] DOB: [birth year]    ADMISSION DATE:  02/17/2016 CONSULTATION DATE:  02/26/16  REFERRING MD:  Greer Pickerel MD  CHIEF COMPLAINT:  Dyspnea, respiratory failure.  HISTORY OF PRESENT ILLNESS:   75 year old with past medical history of diabetes mellitus, diverticulosis, hypertension, GERD. Admitted for laparoscopic repair of ventral hernia on 7/26. Postoperative course significant for persistent tachycardia with elevated d-dimer. Cardiology consulted and she got a CTA which did not show any PE but bilateral consolidation consistent with pneumonia. She also has persistent ileus an NG tube placed for decompression. PCCM consulted for worsening dyspnea, hypoxia.  PAST MEDICAL HISTORY :  She  has a past medical history of Arthritis; BACK PAIN, LUMBAR (05/21/2009); Cataracts, bilateral; COLONIC POLYPS, ADENOMATOUS (08/21/2007); DIABETES MELLITUS, TYPE II (04/19/2007); DIVERTICULOSIS, COLON (07/20/2006); GASTROESOPHAGEAL REFLUX DISEASE (08/21/2007); HIP PAIN (05/24/2010); HYPERLIPIDEMIA (04/19/2007); HYPERTENSION (04/19/2007); MENOPAUSAL SYNDROME (04/19/2007); and OSTEOPOROSIS (04/19/2007).  PAST SURGICAL HISTORY: She  has a past surgical history that includes Breast biopsy (1990's); Tubal ligation; Ventral hernia repair (02/17/2016); Ventral hernia repair (N/A, 02/17/2016); and Insertion of mesh (N/A, 02/17/2016).  Allergies  Allergen Reactions  . No Known Allergies     No current facility-administered medications on file prior to encounter.    Current Outpatient Prescriptions on File Prior to Encounter  Medication Sig  . aspirin 325 MG tablet Take 325 mg by mouth daily.    . colestipol (COLESTID) 1 g tablet Take 3 tablets (3 g total) by mouth daily. (Patient taking differently: Take 3 g by mouth at bedtime. )  . glimepiride (AMARYL) 1 MG tablet Take 0.5 tablets (0.5 mg total) by mouth daily with breakfast.  . lisinopril  (PRINIVIL,ZESTRIL) 40 MG tablet take 1 tablet by mouth once daily (Patient taking differently: take 1 tablet by mouth once daily    takes at bedtime)  . metFORMIN (GLUCOPHAGE) 500 MG tablet take 2 tablets by mouth twice a day with meals  . NIFEdipine (NIFEDICAL XL) 30 MG 24 hr tablet Take 1 tablet (30 mg total) by mouth daily. (Patient taking differently: Take 30 mg by mouth at bedtime. )  . simvastatin (ZOCOR) 40 MG tablet take 2 tablets by mouth at bedtime  . traMADol (ULTRAM) 50 MG tablet take 1 tablet by mouth every 4 hours if needed for pain    FAMILY HISTORY:  Her indicated that her mother is deceased. She indicated that her father is deceased. She indicated that the status of her neg hx is unknown.    SOCIAL HISTORY: She  reports that she has never smoked. She has never used smokeless tobacco. She reports that she drinks alcohol. She reports that she does not use drugs.  REVIEW OF SYSTEMS:   Denies any cough, sputum production. Positive for dyspnea on exertion. Denies any chest pain, palpitation. Denies any diarrhea, constipation. No fevers, chills, malaise, loss of weight, loss of appetite. OTHER review of systems are negative.  SUBJECTIVE:    VITAL SIGNS: BP 110/67 (BP Location: Left Arm)   Pulse (!) 116   Temp 98.1 F (36.7 C) (Oral)   Resp 19   Ht 5\' 5"  (1.651 m)   Wt 160 lb (72.6 kg)   SpO2 96%   BMI 26.63 kg/m   HEMODYNAMICS:    VENTILATOR SETTINGS:    INTAKE / OUTPUT: I/O last 3 completed shifts: In: 2855 [P.O.:180; I.V.:2675] Out: [id number] [Urine:1700; Emesis/NG output:2150]  PHYSICAL EXAMINATION: General:  Elderly female, in mild distress. Neuro: Moves  all 4 extremities, no focal deficits. HEENT:  No thyromegaly, JVD. Cardiovascular:  Tachycardia, regular rate, no murmurs rubs gallops. Lungs:  Clear to auscultation anteriorly, no wheeze, crackles Abdomen:  Distended, diminished bowel sounds, nontender, nondistended Musculoskeletal:  Normal tone and  bulk Skin:  Intact  LABS:  BMET  Recent Labs Lab 02/24/16 0508 02/25/16 0229 02/26/16 0413  NA 135 135 141  K 4.9 5.4* 5.0  CL 107 108 111  CO2 20* 20* 16*  BUN 12 25* 27*  CREATININE 0.97 1.54* 1.18*  GLUCOSE 171* 98 110*    Electrolytes  Recent Labs Lab 02/24/16 0508 02/25/16 0229 02/26/16 0413 02/26/16 1125  CALCIUM 8.8* 8.7* 8.5*  --   MG 1.2* 2.0  --  2.5*  PHOS  --   --   --  3.1    CBC  Recent Labs Lab 02/22/16 0435 02/24/16 1045 02/26/16 0729  WBC 4.8 6.5 9.4  HGB 13.3 12.0 11.5*  HCT 40.6 36.1 35.4*  PLT 273 369 297    Coag's No results for input(s): APTT, INR in the last 168 hours.  Sepsis Markers No results for input(s): LATICACIDVEN, PROCALCITON, O2SATVEN in the last 168 hours.  ABG No results for input(s): PHART, PCO2ART, PO2ART in the last 168 hours.  Liver Enzymes No results for input(s): AST, ALT, ALKPHOS, BILITOT, ALBUMIN in the last 168 hours.  Cardiac Enzymes No results for input(s): TROPONINI, PROBNP in the last 168 hours.  Glucose  Recent Labs Lab 02/25/16 1146 02/25/16 1817 02/25/16 2117 02/25/16 2346 02/26/16 0357 02/26/16 0856  GLUCAP 108* 134* 119* 115* 109* 125*    Imaging Dg Abd Acute W/chest  Result Date: 02/26/2016 CLINICAL DATA:  Nine days postoperative from laparoscopic ventral hernia repair. Patient reports abdominal distention and increased pain over the past several days. Patient has had 2 bowel movements ; noisy breathing, productive cough. EXAM: DG ABDOMEN ACUTE W/ 1V CHEST COMPARISON:  CT scan of the chest of February 24, 2016 FINDINGS: There is persistent infiltrate in the inferior aspect of the right upper lobe. There small bilateral pleural effusions. There is bibasilar atelectasis or pneumonia. The heart and pulmonary vascularity are normal. There is calcification in the wall of the aortic arch. There is an esophagogastric tube present whose tip lies in the gastric cardia. Within the abdomen there are  moderately distended gas-filled loops of small bowel stacked in the midline. There is a small amount of stool and gas in the right colon. There is no rectal gas. No free extraluminal gas collections are observed. The bony structures exhibit no acute abnormalities. There degenerative changes of the lumbar spine with gentle dextrocurvature. IMPRESSION: 1. Inferior right upper lobe pneumonia, bibasilar atelectasis and trace pleural effusions, stable. 2. Mid to distal small bowel obstruction without evidence of perforation. Electronically Signed   By: David  Martinique M.D.   On: 02/26/2016 08:46     STUDIES:  CTA 8/2 > no PE, consolidation in the right lower lobe, small bilateral effusions.  CULTURES: Bcx 8/4 > Sputum Cx 8/4  ANTIBIOTICS: None  SIGNIFICANT EVENTS: 7/26 > Laparoscopic repair of ventral hernia  LINES/TUBES: PICC 8/4 >  DISCUSSION: 75 year old with worsening dyspnea, hypoxia. Status post laparoscopic repair of ventral hernia 7/26. CT of the chest reviewed. The imaging is consistent with HCAP. She may have aspirated given her issues with emesis, ileus.  I would favor treating her with antibiotics for HCAP as her resp status is worsening. Checking pro calcitonin and cultures. We would be able to  narrow antibiotics based on these results.   ASSESSMENT / PLAN: Resp failure with hypoxia HCAP S/p repair of ventral hernia Ileus Tachycardia  - Start vanco, zosyn - Check sputum and blood cultures - Check procalciitonin - Continue incentive spirometry and flutter valve. - Mobilize as able - Continue supplemental O2.  We will continue to follow.  Marshell Garfinkel MD [facility] Pager [phone] If no answer or after 3pm call: [phone] 02/26/2016, 12:10 PM

## 2016-02-26 NOTE — Progress Notes (Signed)
Initial Nutrition Assessment  DOCUMENTATION CODES:   Not applicable  INTERVENTION:   -TPN management per pharmacy -RD will follow for diet advancement  NUTRITION DIAGNOSIS:   Inadequate oral intake related to altered GI function as evidenced by NPO status.  GOAL:   Patient will meet greater than or equal to 90% of their needs  MONITOR:   Diet advancement, Labs, Weight trends, Skin, I & O's  REASON FOR ASSESSMENT:   Consult New TPN/TNA  ASSESSMENT:   The patient is a 75 year old female who presents for an evaluation of a hernia. We are asked to see the patient in consultation by Dr. Vania Rea to evaluate her for a ventral hernia.  The patient is a 75 year old black female who has had a bulge along her upper abdomen for the last 4-5 years.  Over the last several months it has become more noticeable and sore.  She denies any nausea or vomiting.  Her appetite is good and her bowels are working normally.  Pt admitted with supraumbilical hernia.   S/p Procedure(s) 02/17/16: LAPAROSCOPIC VENTRAL HERNIA REPAIR WITH MESH (N/A) INSERTION OF MESH (N/A)  Hx obtained from pt at bedside. She reveals good appetite PTA, however, intake has been minimal since surgery. She shares "once they give me a diet, I don't feel like eating". Per MD notes pt experienced persistent emesis and bowel distention and NGT placed on 02/24/16. Noted approximately 800 ml output within the past 24 hours per doc flowsheets.   Pt denies wt loss. Wt hx reveals wt stability.   Nutrition-Focused physical exam completed. Findings are no fat depletion, no muscle depletion, and no edema. Pt reports she is very active at baseline.   PICC placed today prior to RD visit.  Reviewed pharmacy note. Plan to initiate Clinimix 5/15 at 51ml/hr at 1800 PM  Regimen provides 1160 kcal and 48 grams protein, which meets 68% of estimated kcal needs and 60% of estimated protein needs.  Labs reviewed: CBGS: 138-140.   Diet Order:   Diet NPO time specified TPN (CLINIMIX) Adult without lytes  Skin:  Reviewed, no issues  Last BM:  02/26/16  Height:   Ht Readings from Last 1 Encounters:  02/21/16 5\' 5"  (1.651 m)    Weight:   Wt Readings from Last 1 Encounters:  02/21/16 160 lb (72.6 kg)    Ideal Body Weight:  56.8 kg  BMI:  Body mass index is 26.63 kg/m.  Estimated Nutritional Needs:   Kcal:  1700-1900  Protein:  80-95 grams  Fluid:  >1.6 L  EDUCATION NEEDS:   No education needs identified at this time  Brihany Butch A. Jimmye Norman, RD, LDN, CDE Pager: [phone] After hours Pager: [phone]

## 2016-02-26 NOTE — Progress Notes (Signed)
Physical Therapy Treatment Patient Details Name: Lindsey Jenkins MRN: [id number] DOB: [birth year] Today's Date: 02/26/2016    History of Present Illness Pt is a 75 y/o female s/p ventral hernis repair. Hospital course complicated by ileus. PMH including but not limited to DM and HTN.    PT Comments    Pt performed increased mobility.  Imaging shows new PNA and SBO.  Pt labored with her breathing during session.  Co-treat performed to conserve energy and address PT/OT needs.  Pt educated to ambulate this weekend.  HR 133 bpm, SPO2 2L 92-94%.  Pt with DOE.     Follow Up Recommendations  Supervision for mobility/OOB     Equipment Recommendations  None recommended by PT    Recommendations for Other Services       Precautions / Restrictions Precautions Precautions: Fall Precaution Comments: abdominal binder Restrictions Weight Bearing Restrictions: No    Mobility  Bed Mobility Overal bed mobility: Needs Assistance Bed Mobility: Supine to Sit     Supine to sit: Min guard     General bed mobility comments: Pt required cues for hand placement and slow to transition, Labored breathing noted edge of bed with patient on 2L of O2 at this time.    Transfers Overall transfer level: Needs assistance Equipment used: Rolling walker (2 wheeled) Transfers: Sit to/from Stand Sit to Stand: Min guard         General transfer comment: Pt performed with min guard for safety, no assist needed.    Ambulation/Gait Ambulation/Gait assistance: Min guard Ambulation Distance (Feet): 120 Feet Assistive device: Rolling walker (2 wheeled) Gait Pattern/deviations: Step-through pattern;Trunk flexed Gait velocity: decreased   General Gait Details: Pt fatigued and required cues for pacing and upright posture.  Standing break noted x2.  SPO2 92%-94% with HR elevated to 133bpm.  DOE 3/4.     Stairs            Wheelchair Mobility    Modified Rankin (Stroke Patients Only)       Balance      Sitting balance-Leahy Scale: Good       Standing balance-Leahy Scale: Fair                      Cognition Arousal/Alertness: Awake/alert Behavior During Therapy: WFL for tasks assessed/performed Overall Cognitive Status: Within Functional Limits for tasks assessed                      Exercises Other Exercises Other Exercises: Pt performed Incentive spirometer 1x10 reps avg 750 ml.  Pt educated to perform 1x10 every hour.  Pt agreeable and demonstrates good technique after corrections.      General Comments        Pertinent Vitals/Pain Pain Assessment: Faces Faces Pain Scale: No hurt    Home Living                      Prior Function            PT Goals (current goals can now be found in the care plan section) Acute Rehab PT Goals Patient Stated Goal: return home Potential to Achieve Goals: Good Progress towards PT goals: Progressing toward goals    Frequency  Min 3X/week    PT Plan Current plan remains appropriate    Co-evaluation PT/OT/SLP Co-Evaluation/Treatment: Yes Reason for Co-Treatment: Complexity of the patient's impairments (multi-system involvement);For patient/therapist safety (Pt weaker after change in med status.  )  End of Session Equipment Utilized During Treatment: Gait belt Activity Tolerance: Patient limited by pain Patient left: in chair;with call bell/phone within reach     Time: [id number] PT Time Calculation (min) (ACUTE ONLY): 25 min  Charges:  $Gait Training: 8-22 mins                    G Codes:      Cristela Blue 03-08-16, 4:45 PM  Governor Rooks, PTA pager [phone]

## 2016-02-26 NOTE — Progress Notes (Addendum)
9 Days Post-Op  Subjective: Feels more sob. Had to be placed back on oxygen. No productive cough. No flatus. But had spontaneous BM yesterday. Took in about 4 cups of ice per pt. Ambulated.   Objective: Vital signs in last 24 hours: Temp:  [98 F (36.7 C)-98.1 F (36.7 C)] 98.1 F (36.7 C) (08/04 0700) Pulse Rate:  [102-124] 102 (08/04 0700) Resp:  [19-24] 19 (08/04 0700) BP: (118-134)/(59-77) 123/59 (08/04 0700) SpO2:  [91 %-94 %] 94 % (08/04 0700) Last BM Date: 02/25/16  Intake/Output from previous day: 08/03 0701 - 08/04 0700 In: 2855 [P.O.:180; I.V.:2675] Out: 1950 [Urine:800; Emesis/NG output:1150] Intake/Output this shift: No intake/output data recorded.  Nontoxic but appears a little winded, sob Persistent tachycardia Distended but improved, hypoBS, incisions c/d/i No edema  Lab Results:   Recent Labs  02/24/16 1045  WBC 6.5  HGB 12.0  HCT 36.1  PLT 369   BMET  Recent Labs  02/25/16 0229 02/26/16 0413  NA 135 141  K 5.4* 5.0  CL 108 111  CO2 20* 16*  GLUCOSE 98 110*  BUN 25* 27*  CREATININE 1.54* 1.18*  CALCIUM 8.7* 8.5*   PT/INR No results for input(s): LABPROT, INR in the last 72 hours. ABG No results for input(s): PHART, HCO3 in the last 72 hours.  Invalid input(s): PCO2, PO2  Studies/Results: Ct Angio Chest Pe W Or Wo Contrast  Result Date: 02/24/2016 CLINICAL DATA:  Abnormal chest x-ray earlier today questioning a right perihilar mass. Elevated D-dimer and tachycardia. Ventral hernia repair with mesh on 02/17/2016. EXAM: CT ANGIOGRAPHY CHEST CT ABDOMEN AND PELVIS WITH CONTRAST TECHNIQUE: Multidetector CT imaging of the chest was performed using the standard protocol during bolus administration of intravenous contrast. Multiplanar CT image reconstructions and MIPs were obtained to evaluate the vascular anatomy. Multidetector CT imaging of the abdomen and pelvis was performed using the standard protocol during bolus administration of intravenous  contrast. CONTRAST:  100 ml Isovue 370 IV. Oral contrast was also administered. COMPARISON:  No prior CT. Acute abdomen series earlier today is correlated. FINDINGS: CTA CHEST FINDINGS Cardiovascular: Contrast opacification of the pulmonary arteries is good. Respiratory motion blurs images throughout the chest. Overall, the study is of moderate to good diagnostic quality. No filling defects identified in either main pulmonary artery or their central branches in either lung to suggest pulmonary embolism. Heart mildly enlarged. No pericardial effusion. Mild atherosclerosis involving a diagonal branch of the LAD. Mild to moderate atherosclerosis involving the thoracic and upper abdominal aorta without evidence of aneurysm. Mediastinum/Lymph nodes: No mediastinal mass. No pathologic lymphadenopathy. Oral contrast material within a dilated esophagus throughout. Small hiatal hernia with possible stricture at the EG junction. Multiple nodules involving both lobes of the thyroid gland. Lungs/Pleura: Peripheral dense consolidation in the posterior superior right lower lobe and dense consolidation in the deep posterior inferior right lower lobe. Dense consolidation to a lesser degree in the deep posterior inferior left lower lobe. Small bilateral pleural effusions. No evidence of interstitial lung disease. Central airways patent with isolated bronchial wall thickening involving the right lower lobe bronchi. Musculoskeletal: Degenerative disc disease and spondylosis throughout the thoracic spine. No acute abnormalities. CT ABDOMEN and PELVIS FINDINGS Hepatobiliary: Liver normal in size and appearance. Gallbladder contracted without evidence of cholelithiasis. Pancreas: Mildly atrophic without focal parenchymal abnormality. Spleen: Normal in size and appearance. Adrenals/Urinary tract: Normal appearing adrenal glands. Kidneys normal in size and appearance without focal parenchymal abnormality. No evidence of urinary tract  calculi or obstruction. Normal-appearing  urinary bladder. Stomach/Bowel: Small hiatal hernia as mentioned above. Moderate gastric distention. Numerous dilated, fluid-filled loops of small bowel in the abdomen and pelvis, with a transition to more normal caliber small bowel in the upper pelvis. Distal small bowel decompressed. Entire colon decompressed. Several small bowel loops in the left side of the abdomen are position lateral to the descending colon. Scattered sigmoid colon diverticula without evidence of acute diverticulitis. Normal appendix in the right mid abdomen and right upper pelvis. Vascular/Lymphatic: Moderate to severe aortoiliac atherosclerosis without aneurysm. Normal-appearing portal venous and systemic venous systems. No pathologic lymphadenopathy. Reproductive: Uterus atrophic.  No adnexal masses. Other: Small amount of ascites dependently in the pelvis. Diffuse body wall edema. Musculoskeletal: Multilevel degenerative disc disease, spondylosis and facet degenerative changes involving the lumbar spine, worst at L5-S1. No acute abnormalities. Review of the MIP images confirms the above findings. IMPRESSION: 1. No evidence of pulmonary embolism. 2. Pneumonia involving the lower lobes bilaterally. Dense airspace consolidation in the upper posterior right lower lobe accounts for the chest x-ray findings earlier today. 3. Small bilateral pleural effusions. 4. Partial small bowel obstruction with the transition to normal caliber small bowel in the upper pelvis. 5. Possible internal hernia as there are numerous small bowel loops lateral to the descending colon. 6. Small hiatal hernia. Possible stricture at the EG junction. Oral contrast material throughout the mildly distended esophagus likely indicates reflux and/or esophageal dysmotility. 7. Small amount of ascites dependently in the pelvis. 8. Generalized atherosclerosis. 9. Multinodular goiter. Electronically Signed   By: Evangeline Dakin M.D.   On:  02/24/2016 17:13   Ct Abdomen Pelvis W Contrast  Result Date: 02/24/2016 CLINICAL DATA:  Abnormal chest x-ray earlier today questioning a right perihilar mass. Elevated D-dimer and tachycardia. Ventral hernia repair with mesh on 02/17/2016. EXAM: CT ANGIOGRAPHY CHEST CT ABDOMEN AND PELVIS WITH CONTRAST TECHNIQUE: Multidetector CT imaging of the chest was performed using the standard protocol during bolus administration of intravenous contrast. Multiplanar CT image reconstructions and MIPs were obtained to evaluate the vascular anatomy. Multidetector CT imaging of the abdomen and pelvis was performed using the standard protocol during bolus administration of intravenous contrast. CONTRAST:  100 ml Isovue 370 IV. Oral contrast was also administered. COMPARISON:  No prior CT. Acute abdomen series earlier today is correlated. FINDINGS: CTA CHEST FINDINGS Cardiovascular: Contrast opacification of the pulmonary arteries is good. Respiratory motion blurs images throughout the chest. Overall, the study is of moderate to good diagnostic quality. No filling defects identified in either main pulmonary artery or their central branches in either lung to suggest pulmonary embolism. Heart mildly enlarged. No pericardial effusion. Mild atherosclerosis involving a diagonal branch of the LAD. Mild to moderate atherosclerosis involving the thoracic and upper abdominal aorta without evidence of aneurysm. Mediastinum/Lymph nodes: No mediastinal mass. No pathologic lymphadenopathy. Oral contrast material within a dilated esophagus throughout. Small hiatal hernia with possible stricture at the EG junction. Multiple nodules involving both lobes of the thyroid gland. Lungs/Pleura: Peripheral dense consolidation in the posterior superior right lower lobe and dense consolidation in the deep posterior inferior right lower lobe. Dense consolidation to a lesser degree in the deep posterior inferior left lower lobe. Small bilateral pleural  effusions. No evidence of interstitial lung disease. Central airways patent with isolated bronchial wall thickening involving the right lower lobe bronchi. Musculoskeletal: Degenerative disc disease and spondylosis throughout the thoracic spine. No acute abnormalities. CT ABDOMEN and PELVIS FINDINGS Hepatobiliary: Liver normal in size and appearance. Gallbladder contracted without evidence of  cholelithiasis. Pancreas: Mildly atrophic without focal parenchymal abnormality. Spleen: Normal in size and appearance. Adrenals/Urinary tract: Normal appearing adrenal glands. Kidneys normal in size and appearance without focal parenchymal abnormality. No evidence of urinary tract calculi or obstruction. Normal-appearing urinary bladder. Stomach/Bowel: Small hiatal hernia as mentioned above. Moderate gastric distention. Numerous dilated, fluid-filled loops of small bowel in the abdomen and pelvis, with a transition to more normal caliber small bowel in the upper pelvis. Distal small bowel decompressed. Entire colon decompressed. Several small bowel loops in the left side of the abdomen are position lateral to the descending colon. Scattered sigmoid colon diverticula without evidence of acute diverticulitis. Normal appendix in the right mid abdomen and right upper pelvis. Vascular/Lymphatic: Moderate to severe aortoiliac atherosclerosis without aneurysm. Normal-appearing portal venous and systemic venous systems. No pathologic lymphadenopathy. Reproductive: Uterus atrophic.  No adnexal masses. Other: Small amount of ascites dependently in the pelvis. Diffuse body wall edema. Musculoskeletal: Multilevel degenerative disc disease, spondylosis and facet degenerative changes involving the lumbar spine, worst at L5-S1. No acute abnormalities. Review of the MIP images confirms the above findings. IMPRESSION: 1. No evidence of pulmonary embolism. 2. Pneumonia involving the lower lobes bilaterally. Dense airspace consolidation in the  upper posterior right lower lobe accounts for the chest x-ray findings earlier today. 3. Small bilateral pleural effusions. 4. Partial small bowel obstruction with the transition to normal caliber small bowel in the upper pelvis. 5. Possible internal hernia as there are numerous small bowel loops lateral to the descending colon. 6. Small hiatal hernia. Possible stricture at the EG junction. Oral contrast material throughout the mildly distended esophagus likely indicates reflux and/or esophageal dysmotility. 7. Small amount of ascites dependently in the pelvis. 8. Generalized atherosclerosis. 9. Multinodular goiter. Electronically Signed   By: Evangeline Dakin M.D.   On: 02/24/2016 17:13   Dg Abd Acute W/chest  Result Date: 02/24/2016 CLINICAL DATA:  Status post laparoscopic ventral hernia repair with on going vomiting and distention. EXAM: DG ABDOMEN ACUTE W/ 1V CHEST COMPARISON:  None. FINDINGS: Frontal view of the chest shows abnormal right parahilar density. Cardiopericardial silhouette is at upper limits of normal for size. Bibasilar atelectasis noted with tiny bilateral pleural effusions. No evidence for pulmonary edema. Upright film shows no evidence for intraperitoneal free air. Supine views of the abdomen show diffuse gaseous small bowel distention with associated air-fluid levels. Small bowel loops measure up to 5 cm in diameter. IMPRESSION: 1. Focal abnormal right parahilar opacity. Imaging features are not entirely consistent with pneumonia given the size and relatively well-defined margins. Parahilar mass is a concern. CT chest with contrast recommended to further evaluate. 2. Diffuse gaseous bowel distention with air-fluid levels. Patient is postop day 7 which is gaining towards the latter timeframe of typical postoperative adynamic ileus. Given that there is limited colonic gas visualized, evolving small bowel obstruction must be considered. Electronically Signed   By: Misty Stanley M.D.   On:  02/24/2016 09:05    Anti-infectives: Anti-infectives    Start     Dose/Rate Route Frequency Ordered Stop   02/17/16 0800  ceFAZolin (ANCEF) IVPB 2g/100 mL premix     2 g 200 mL/hr over 30 Minutes Intravenous To [facility] 02/16/16 1059 02/17/16 0829      Assessment/Plan: s/p Procedure(s): LAPAROSCOPIC VENTRAL HERNIA REPAIR WITH MESH (N/A) INSERTION OF MESH (N/A)  Ileus - cont bowel rest, npo, NG tube to LIWS. The intestines were not manipulated during surgery so I don't think she has internal hernia. No wbc,  no fever, no wall thickening. Had about 1800cc out yesterday but output is a lot clearer than other days plus pt took in 4 cups of ice. Will check plain film today. If bowel gas pattern is improved will clamp NG  HTN/tachy - doubt she will absorb oral meds. IV lopressor. Holding other BP meds. Cards following. Appreciate input  Pulm - consolidation in both lobes on CT PE protocol other day. No PE on imaging. Clinically pt doesn't have PNA, no cough, no sputum. Today pt is more SOB than before. Will try to obtain sputum sample. Consult pulm. Given more SOB, oxygen requirement will defer to pulm whether or not pt has HAP.   Thyroid labs - low TSH but T4 normal and T3 low. Old neck u/s showed b/l thyroid nodules. Discussed with Dr Dwyane Dee, endocrine. Seems like subclinical hyperthyroidism in setting of multinodular thyroid. Nothing to do while inpt. Can have outpt f/u with Dr Loanne Drilling  Renal - uop ok. Cr trending down. prob combo of fluid losses and IV dye,. Avoid nephrotoxic meds  F/E/N - hyperkalemia resolved- no k in ivf. Cont mIVF to 125. Repeat labs in am. If bowel gas pattern is still abnormal on imaging today, then would proceed with picc/tpn. If bowel gas pattern is significantly improved, will clamp NG  VTE prophylaxis - scds, subcu heparin  Cont pt/ot Discussed plan with pt.  i'm post call so I have signed out imaging and plan to Dr Alger Memos. [name],  MD, FACS General, Bariatric, & Minimally Invasive Surgery [facility], [place]   LOS: 4 days    Gayland Curry 02/26/2016

## 2016-02-27 DIAGNOSIS — J189 Pneumonia, unspecified organism: Secondary | ICD-10-CM

## 2016-02-27 LAB — CBC
HCT: 35.7 % — ABNORMAL LOW (ref 36.0–46.0)
Hemoglobin: 11.9 g/dL — ABNORMAL LOW (ref 12.0–15.0)
MCH: 25.7 pg — ABNORMAL LOW (ref 26.0–34.0)
MCHC: 33.3 g/dL (ref 30.0–36.0)
MCV: 77.1 fL — AB (ref 78.0–100.0)
PLATELETS: 272 10*3/uL (ref 150–400)
RBC: 4.63 MIL/uL (ref 3.87–5.11)
RDW: 14.9 % (ref 11.5–15.5)
WBC: 12.5 10*3/uL — AB (ref 4.0–10.5)

## 2016-02-27 LAB — URINALYSIS, ROUTINE W REFLEX MICROSCOPIC
BILIRUBIN URINE: NEGATIVE
Glucose, UA: NEGATIVE mg/dL
HGB URINE DIPSTICK: NEGATIVE
KETONES UR: 15 mg/dL — AB
Leukocytes, UA: NEGATIVE
Nitrite: NEGATIVE
PROTEIN: NEGATIVE mg/dL
Specific Gravity, Urine: 1.016 (ref 1.005–1.030)
pH: 5.5 (ref 5.0–8.0)

## 2016-02-27 LAB — COMPREHENSIVE METABOLIC PANEL
ALK PHOS: 55 U/L (ref 38–126)
ALT: 26 U/L (ref 14–54)
ANION GAP: 6 (ref 5–15)
AST: 21 U/L (ref 15–41)
Albumin: 2.1 g/dL — ABNORMAL LOW (ref 3.5–5.0)
BILIRUBIN TOTAL: 0.6 mg/dL (ref 0.3–1.2)
BUN: 12 mg/dL (ref 6–20)
CALCIUM: 8.6 mg/dL — AB (ref 8.9–10.3)
CO2: 21 mmol/L — AB (ref 22–32)
CREATININE: 0.79 mg/dL (ref 0.44–1.00)
Chloride: 112 mmol/L — ABNORMAL HIGH (ref 101–111)
GFR calc non Af Amer: >60 mL/min (ref >60)
GLUCOSE: 233 mg/dL — AB (ref 65–99)
Potassium: 3.7 mmol/L (ref 3.5–5.1)
Sodium: 139 mmol/L (ref 135–145)
TOTAL PROTEIN: 5.1 g/dL — AB (ref 6.5–8.1)

## 2016-02-27 LAB — TRIGLYCERIDES: Triglycerides: 219 mg/dL — ABNORMAL HIGH (ref <150)

## 2016-02-27 LAB — PHOSPHORUS: Phosphorus: 1.2 mg/dL — ABNORMAL LOW (ref 2.5–4.6)

## 2016-02-27 LAB — GLUCOSE, CAPILLARY
GLUCOSE-CAPILLARY: 228 mg/dL — AB (ref 65–99)
GLUCOSE-CAPILLARY: 231 mg/dL — AB (ref 65–99)
Glucose-Capillary: 180 mg/dL — ABNORMAL HIGH (ref 65–99)
Glucose-Capillary: 217 mg/dL — ABNORMAL HIGH (ref 65–99)
Glucose-Capillary: 249 mg/dL — ABNORMAL HIGH (ref 65–99)
Glucose-Capillary: 302 mg/dL — ABNORMAL HIGH (ref 65–99)

## 2016-02-27 LAB — PREALBUMIN: PREALBUMIN: 4 mg/dL — AB (ref 18–38)

## 2016-02-27 LAB — MAGNESIUM: Magnesium: 1.6 mg/dL — ABNORMAL LOW (ref 1.7–2.4)

## 2016-02-27 MED ORDER — TRACE MINERALS CR-CU-MN-SE-ZN 10-1000-500-60 MCG/ML IV SOLN
INTRAVENOUS | Status: AC
  Administered 2016-02-27: 18:00:00 via INTRAVENOUS
  Filled 2016-02-27: qty 960

## 2016-02-27 MED ORDER — CETYLPYRIDINIUM CHLORIDE 0.05 % MT LIQD
7.0000 mL | Freq: Two times a day (BID) | OROMUCOSAL | Status: DC
  Administered 2016-02-27 – 2016-03-02 (×8): 7 mL via OROMUCOSAL

## 2016-02-27 MED ORDER — INSULIN ASPART 100 UNIT/ML ~~LOC~~ SOLN
0.0000 [IU] | SUBCUTANEOUS | Status: DC
Start: 2016-02-27 — End: 2016-03-01
  Administered 2016-02-27: 5 [IU] via SUBCUTANEOUS
  Administered 2016-02-27: 11 [IU] via SUBCUTANEOUS
  Administered 2016-02-27: 5 [IU] via SUBCUTANEOUS
  Administered 2016-02-28: 3 [IU] via SUBCUTANEOUS
  Administered 2016-02-28 (×2): 5 [IU] via SUBCUTANEOUS
  Administered 2016-02-28: 3 [IU] via SUBCUTANEOUS
  Administered 2016-02-28: 8 [IU] via SUBCUTANEOUS
  Administered 2016-02-28 – 2016-02-29 (×3): 3 [IU] via SUBCUTANEOUS
  Administered 2016-02-29: 8 [IU] via SUBCUTANEOUS
  Administered 2016-02-29: 3 [IU] via SUBCUTANEOUS
  Administered 2016-02-29: 5 [IU] via SUBCUTANEOUS
  Administered 2016-02-29: 3 [IU] via SUBCUTANEOUS
  Administered 2016-03-01: 5 [IU] via SUBCUTANEOUS
  Administered 2016-03-01 (×2): 3 [IU] via SUBCUTANEOUS
  Administered 2016-03-01: 8 [IU] via SUBCUTANEOUS
  Administered 2016-03-01: 3 [IU] via SUBCUTANEOUS

## 2016-02-27 MED ORDER — FAT EMULSION 20 % IV EMUL
240.0000 mL | INTRAVENOUS | Status: AC
Start: 2016-02-27 — End: 2016-02-28
  Administered 2016-02-27: 240 mL via INTRAVENOUS
  Filled 2016-02-27: qty 250

## 2016-02-27 MED ORDER — POTASSIUM PHOSPHATES 15 MMOLE/5ML IV SOLN
20.0000 mmol | Freq: Once | INTRAVENOUS | Status: AC
  Administered 2016-02-27: 20 mmol via INTRAVENOUS
  Filled 2016-02-27 (×2): qty 6.67

## 2016-02-27 MED ORDER — MAGNESIUM SULFATE 2 GM/50ML IV SOLN
2.0000 g | Freq: Once | INTRAVENOUS | Status: AC
  Administered 2016-02-27: 2 g via INTRAVENOUS
  Filled 2016-02-27 (×2): qty 50

## 2016-02-27 NOTE — Progress Notes (Signed)
Patient Name: Lindsey Jenkins Date of Encounter: 02/27/2016  Active Problems:   Ventral hernia without obstruction or gangrene   Tachycardia with 121 - 140 beats per minute   HCAP (healthcare-associated pneumonia)    Patient Profile: 75 yo female with PMH of HTN, HLD, and DMII, but no past cardiac history presented with ventral hernia repair, cardiology consulted post op for persistent tachycardia. D-dimer elevated at 19.32. TSH also suppressed at 0.277.   SUBJECTIVE: Has pain in abdomen. Denies chest pain. Does feel SOB. "Never had a problem with my heart before".   OBJECTIVE Vitals:   02/26/16 1500 02/26/16 2028 02/27/16 0211 02/27/16 0542  BP: 130/68 122/65 130/69 136/75  Pulse: (!) 121 (!) 114 (!) 111 (!) 117  Resp:  19 19 19   Temp: 97.8 F (36.6 C) 98 F (36.7 C) 98.3 F (36.8 C) 98.5 F (36.9 C)  TempSrc: Oral Oral Oral Oral  SpO2: 96% 96% 97% 98%  Weight:      Height:        Filed Weights   02/17/16 0639 02/21/16 1800  Weight: 160 lb (72.6 kg) 160 lb (72.6 kg)    PHYSICAL EXAM General: Well developed, well nourished, female in no acute distress, mildly ill appearing.  Head: Normocephalic, atraumatic. NGT, TPN Neck: Supple without bruits, no JVD. Lungs:  Resp regular and unlabored, CTA. Heart: Tachy reg, S1, S2, no S3, S4, or murmur; no rub. Abdomen: Soft, non-tender, non-distended, BS + x 4.  Extremities: No clubbing, cyanosis,no edema.  Neuro: Alert and oriented X 3. Moves all extremities spontaneously. Psych: Normal affect.  LABS: CBC:  Recent Labs  02/26/16 0729 02/27/16 0414  WBC 9.4 12.5*  NEUTROABS 6.1  --   HGB 11.5* 11.9*  HCT 35.4* 35.7*  MCV 78.3 77.1*  PLT 297 [id number]   Basic Metabolic Panel:  Recent Labs  02/26/16 0413 02/26/16 1125 02/27/16 0414  NA 141  --  139  K 5.0  --  3.7  CL 111  --  112*  CO2 16*  --  21*  GLUCOSE 110*  --  233*  BUN 27*  --  12  CREATININE 1.18*  --  0.79  CALCIUM 8.5*  --  8.6*  MG  --  2.5* 1.6*    PHOS  --  3.1 1.2*     Recent Labs  02/24/16 1527  T3FREE 1.4*   . sodium chloride 85 mL/hr at 02/27/16 0020  . TPN (CLINIMIX) Adult without lytes 40 mL/hr at 02/26/16 1729   And  . fat emulsion 240 mL (02/26/16 1729)  . Marland KitchenTPN (CLINIMIX-E) Adult     And  . fat emulsion      Scheduled Meds: . antiseptic oral rinse  7 mL Mouth Rinse BID  . heparin  5,000 Units Subcutaneous Q8H  . insulin aspart  0-15 Units Subcutaneous Q4H  . magnesium sulfate 1 - 4 g bolus IVPB  2 g Intravenous Once  . metoprolol  5 mg Intravenous Q4H  . piperacillin-tazobactam (ZOSYN)  IV  3.375 g Intravenous Q8H  . potassium phosphate IVPB (mmol)  20 mmol Intravenous Once  . sodium chloride flush  10-40 mL Intracatheter Q12H  . vancomycin  750 mg Intravenous Q12H   Continuous Infusions: . sodium chloride 85 mL/hr at 02/27/16 0020  . TPN (CLINIMIX) Adult without lytes 40 mL/hr at 02/26/16 1729   And  . fat emulsion 240 mL (02/26/16 1729)  . Marland KitchenTPN (CLINIMIX-E) Adult  And  . fat emulsion     PRN Meds:.menthol-cetylpyridinium, methocarbamol, morphine injection, ondansetron **OR** ondansetron (ZOFRAN) IV, sodium chloride flush  TELE:  Sinus tach 110-120's.        ECHO Study Conclusions  - Left ventricle: The cavity size was normal. Wall thickness was increased in a pattern of moderate LVH. Systolic function was vigorous. The estimated ejection fraction was in the range of 65% to 70%. Wall motion was normal; there were no regional wall motion abnormalities. Doppler parameters are consistent with abnormal left ventricular relaxation (grade 1 diastolic dysfunction). - Pulmonary arteries: PA peak pressure: 40 mm Hg (S). Right ventricle: The cavity size was normal. Wall thickness was normal. Systolic function was normal. Right atrium: The atrium was normal in size.  Radiology/Studies: Dg Abd Acute W/chest  Result Date: 02/26/2016 CLINICAL DATA:  Nine days postoperative from  laparoscopic ventral hernia repair. Patient reports abdominal distention and increased pain over the past several days. Patient has had 2 bowel movements ; noisy breathing, productive cough. EXAM: DG ABDOMEN ACUTE W/ 1V CHEST COMPARISON:  CT scan of the chest of February 24, 2016 FINDINGS: There is persistent infiltrate in the inferior aspect of the right upper lobe. There small bilateral pleural effusions. There is bibasilar atelectasis or pneumonia. The heart and pulmonary vascularity are normal. There is calcification in the wall of the aortic arch. There is an esophagogastric tube present whose tip lies in the gastric cardia. Within the abdomen there are moderately distended gas-filled loops of small bowel stacked in the midline. There is a small amount of stool and gas in the right colon. There is no rectal gas. No free extraluminal gas collections are observed. The bony structures exhibit no acute abnormalities. There degenerative changes of the lumbar spine with gentle dextrocurvature. IMPRESSION: 1. Inferior right upper lobe pneumonia, bibasilar atelectasis and trace pleural effusions, stable. 2. Mid to distal small bowel obstruction without evidence of perforation. Electronically Signed   By: David  Martinique M.D.   On: 02/26/2016 08:46     Current Medications:  . antiseptic oral rinse  7 mL Mouth Rinse BID  . heparin  5,000 Units Subcutaneous Q8H  . insulin aspart  0-15 Units Subcutaneous Q4H  . magnesium sulfate 1 - 4 g bolus IVPB  2 g Intravenous Once  . metoprolol  5 mg Intravenous Q4H  . piperacillin-tazobactam (ZOSYN)  IV  3.375 g Intravenous Q8H  . potassium phosphate IVPB (mmol)  20 mmol Intravenous Once  . sodium chloride flush  10-40 mL Intracatheter Q12H  . vancomycin  750 mg Intravenous Q12H   . sodium chloride 85 mL/hr at 02/27/16 0020  . TPN (CLINIMIX) Adult without lytes 40 mL/hr at 02/26/16 1729   And  . fat emulsion 240 mL (02/26/16 1729)  . Marland KitchenTPN (CLINIMIX-E) Adult     And  .  fat emulsion      ASSESSMENT AND PLAN: Active Problems:   Ventral hernia without obstruction or gangrene   Tachycardia with 121 - 140 beats per minute   HCAP (healthcare-associated pneumonia)  1. Persistent tachycardia:  In the setting of recent surgery/ pain post surgery, Elevated D-dimer of 19 and suppressed TSH.  No CP nor palpitations.   2D echo shows vigorous LV systolic function with EF of 65-70%. Normal RA size and RV function.  No PE on CT but bilateral airspace consolidation ? c/w pneumonia, on abx.  Metoprolol changed to iv dosing 5mg  Q4 with bowel obstruction and oral absorption difficulty;  Will continue  to hold nifedipine.   HR remains elevated in 110 range but improved with increased frequency metoprolol 5mg  IV Q4.  She is in some pain as well, definitely could be contributing.   2. TSH mildly low: TSH 0.277, but free T4 nl and low free T3; doubt hyperthyroidism .  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 Consider endo eval.                                                       3. Ventral hernia s/p repair:  Partial small bowel obstruction on CT  4. AKI:  resolved. Holding lisinopril   5. HTN: stable in 100 - 114 range  6. HLD: on zocor    7. DM II: on Amaryl and metformin at home  8. Lung consolidation: Afebrile. Pulmonary toilet. Pulmonary following.    Candee Furbish, MD  02/27/2016 9:09 AM

## 2016-02-27 NOTE — Progress Notes (Signed)
10 Days Post-Op  Subjective: She continues to make very slow progress. Her respiratory rate is up during exam, he remains tachycardic. She reports feeling very tired and weak with attempts to mobilize. Her NG is not working properly and were working to remedy that. She did have several bowel movements yesterday, and has bowel sounds this a.m. Her hernia sites all look very good  Objective: Vital signs in last 24 hours: Temp:  [97.8 F (36.6 C)-98.5 F (36.9 C)] 98.5 F (36.9 C) (08/05 0542) Pulse Rate:  [111-121] 117 (08/05 0542) Resp:  [19] 19 (08/05 0542) BP: (110-136)/(65-75) 136/75 (08/05 0542) SpO2:  [96 %-98 %] 98 % (08/05 0542) Last BM Date: 02/26/16 By mouth 150 IV: 2589 500 recorded NG 450 recorded BM 4 Afebrile she remains tachycardic heart rate 102-121 range yesterday Labs show magnesium of 1.6, pre-albumin of 4.0 WBC 12.5 H/H is stable Intake/Output from previous day: 08/04 0701 - 08/05 0700 In: 2589.6 [P.O.:150; I.V.:2439.6] Out: 950 [Urine:500; Emesis/NG output:450] Intake/Output this shift: No intake/output data recorded.  General appearance: alert, cooperative, no distress and very deconditioned, and still looks and feels bad. Resp: Rales mostly in the right base. Few on the left. No wheezing or rhonchi. GI: Soft, not distended, she does have a few bowel sounds, or BMs recorded for yesterday. Extremities: Some mild pretibial edema both lower extremities.  Lab Results:   Recent Labs  02/26/16 0729 02/27/16 0414  WBC 9.4 12.5*  HGB 11.5* 11.9*  HCT 35.4* 35.7*  PLT 297 272    BMET  Recent Labs  02/26/16 0413 02/27/16 0414  NA 141 139  K 5.0 3.7  CL 111 112*  CO2 16* 21*  GLUCOSE 110* 233*  BUN 27* 12  CREATININE 1.18* 0.79  CALCIUM 8.5* 8.6*   PT/INR No results for input(s): LABPROT, INR in the last 72 hours.   Recent Labs Lab 02/27/16 0414  AST 21  ALT 26  ALKPHOS 55  BILITOT 0.6  PROT 5.1*  ALBUMIN 2.1*     Lipase  No  results found for: LIPASE   Studies/Results: Dg Abd Acute W/chest  Result Date: 02/26/2016 CLINICAL DATA:  Nine days postoperative from laparoscopic ventral hernia repair. Patient reports abdominal distention and increased pain over the past several days. Patient has had 2 bowel movements ; noisy breathing, productive cough. EXAM: DG ABDOMEN ACUTE W/ 1V CHEST COMPARISON:  CT scan of the chest of February 24, 2016 FINDINGS: There is persistent infiltrate in the inferior aspect of the right upper lobe. There small bilateral pleural effusions. There is bibasilar atelectasis or pneumonia. The heart and pulmonary vascularity are normal. There is calcification in the wall of the aortic arch. There is an esophagogastric tube present whose tip lies in the gastric cardia. Within the abdomen there are moderately distended gas-filled loops of small bowel stacked in the midline. There is a small amount of stool and gas in the right colon. There is no rectal gas. No free extraluminal gas collections are observed. The bony structures exhibit no acute abnormalities. There degenerative changes of the lumbar spine with gentle dextrocurvature. IMPRESSION: 1. Inferior right upper lobe pneumonia, bibasilar atelectasis and trace pleural effusions, stable. 2. Mid to distal small bowel obstruction without evidence of perforation. Electronically Signed   By: David  Martinique M.D.   On: 02/26/2016 08:46    Medications: . antiseptic oral rinse  7 mL Mouth Rinse BID  . heparin  5,000 Units Subcutaneous Q8H  . insulin aspart  0-9 Units  Subcutaneous Q4H  . magnesium sulfate 1 - 4 g bolus IVPB  2 g Intravenous Once  . metoprolol  5 mg Intravenous Q4H  . piperacillin-tazobactam (ZOSYN)  IV  3.375 g Intravenous Q8H  . potassium phosphate IVPB (mmol)  20 mmol Intravenous Once  . sodium chloride flush  10-40 mL Intracatheter Q12H  . vancomycin  750 mg Intravenous Q12H   . sodium chloride 85 mL/hr at 02/27/16 0020  . TPN (CLINIMIX) Adult  without lytes 40 mL/hr at 02/26/16 1729   And  . fat emulsion 240 mL (02/26/16 1729)    Hypertension Type 2 diabetes  Assessment/Plan Ventral hernia Status post ventral hernia repair with mesh, 02/17/16. Dr. Autumn Messing Postop ileus -  NG placement 02/24/16 Tachycardia - sinus rhythm with PVCs. Low magnesium - replace Pneumonia  -   RUL/basilar atelectasis and trace effusion Renal insufficiency  - resolved Abnormal thyroid - ? 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 DVT: SCDs/heparin   Plan: We'll replace her Magnesium, PT is working with her already. Continue to mobilize, continue antibiotics, continue TNA. I'm going to get them to fix the NG and be sure it's working. NG drainage is down I think we can try clamping her tomorrow. Taylor the abdominal binder also. Recheck labs in a.m.   LOS: 5 days    Oleg Oleson 02/27/2016 [phone]

## 2016-02-27 NOTE — Progress Notes (Signed)
PARENTERAL NUTRITION CONSULT NOTE - INITIAL  Pharmacy Consult for TPN Indication: Prolonged Ileus / SBO  Allergies  Allergen Reactions  . No Known Allergies     Patient Measurements: Height: 5\' 5"  (165.1 cm) Weight: 160 lb (72.6 kg) IBW/kg (Calculated) : 57 Adjusted Body Weight: 61.7 kg  Vital Signs: Temp: 98.5 F (36.9 C) (08/05 0542) Temp Source: Oral (08/05 0542) BP: 136/75 (08/05 0542) Pulse Rate: 117 (08/05 0542) Intake/Output from previous day: 08/04 0701 - 08/05 0700 In: 2589.6 [P.O.:150; I.V.:2439.6] Out: 950 [Urine:500; Emesis/NG output:450] Intake/Output from this shift: No intake/output data recorded.  Labs:  Recent Labs  02/24/16 1045 02/26/16 0729 02/27/16 0414  WBC 6.5 9.4 12.5*  HGB 12.0 11.5* 11.9*  HCT 36.1 35.4* 35.7*  PLT 369 297 272     Recent Labs  02/25/16 0229 02/26/16 0413 02/26/16 1125 02/27/16 0414  NA 135 141  --  139  K 5.4* 5.0  --  3.7  CL 108 111  --  112*  CO2 20* 16*  --  21*  GLUCOSE 98 110*  --  233*  BUN 25* 27*  --  12  CREATININE 1.54* 1.18*  --  0.79  CALCIUM 8.7* 8.5*  --  8.6*  MG 2.0  --  2.5* 1.6*  PHOS  --   --  3.1 1.2*  PROT  --   --   --  5.1*  ALBUMIN  --   --   --  2.1*  AST  --   --   --  21  ALT  --   --   --  26  ALKPHOS  --   --   --  55  BILITOT  --   --   --  0.6  PREALBUMIN  --   --   --  4.0*  TRIG  --   --   --  219*   Estimated Creatinine Clearance: 63.4 mL/min (by C-G formula based on SCr of 0.8 mg/dL).    Recent Labs  02/26/16 2003 02/27/16 0011 02/27/16 0345  GLUCAP 160* 180* 217*    Medical History: Past Medical History:  Diagnosis Date  . Arthritis    hands, feet, ? type of arthritis, no treatment yet   . BACK PAIN, LUMBAR 05/21/2009   pt. reports bulging disc in lumbar region, since 2015  . Cataracts, bilateral   . 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 DIABETES MELLITUS, TYPE II 04/19/2007  . DIVERTICULOSIS, COLON 07/20/2006  . GASTROESOPHAGEAL REFLUX DISEASE  08/21/2007  . HIP PAIN 05/24/2010  . HYPERLIPIDEMIA 04/19/2007  . HYPERTENSION 04/19/2007  . MENOPAUSAL SYNDROME 04/19/2007  . OSTEOPOROSIS 04/19/2007    Medications:  Scheduled:  . antiseptic oral rinse  7 mL Mouth Rinse BID  . heparin  5,000 Units Subcutaneous Q8H  . insulin aspart  0-9 Units Subcutaneous Q4H  . metoprolol  5 mg Intravenous Q4H  . piperacillin-tazobactam (ZOSYN)  IV  3.375 g Intravenous Q8H  . sodium chloride flush  10-40 mL Intracatheter Q12H  . vancomycin  750 mg Intravenous Q12H   Infusions:  . sodium chloride 85 mL/hr at 02/27/16 0020  . TPN (CLINIMIX) Adult without lytes 40 mL/hr at 02/26/16 1729   And  . fat emulsion 240 mL (02/26/16 1729)    Insulin Requirements in the past 24 hours:  5 units of sensitive SSI every 4 hours  Assessment: 75 year old female s/p laparoscopic ventral hernia repair with mesh insertion on 7/26. Patient had good appetite  PTA but intake has been minimal since surgery. Now 9 days post-op with ileus and now mid-distal SBO without perforation on abdominal film without response to bowel rest since 8/2 and NG tube to LIWS to have PICC inserted and start TPN on 8/4.   Surgeries/Procedures: 7/26 Laparoscopic ventral hernia repair with mesh insertion  GI: Patient has Ileus/SBO on Abd film. Patient reports persistent emesis and bowel distension so NG tube placed. NG tube was down to 450 ml of output yesterday. Surgery may try clamping NG tube tomorrow. Still reports abdominal pain and weakness but has had several BMs since yesterday. Albumin low at 2.1. Endo: CBGs trending up yesterday (120s to 210s) on SSI. Lytes:Lytes wnl exc K low at 3.7 (goal > 4 with ileus) Phos low at 1.2 and Mg low at 1.6 (goal > 2 with ileus). CoCa 10. NS at 62ml/hr.  Renal: SCr trending down 1.18, UOP 0.5 cc/kg/hr.  Pulm: Placed back on O2 today at 2L [place].  Cards: Lopressor IV q6h - ST (episode of SVT 8/3 PM). BP ok. 8/2 TTE-EF 65%, PAP 40.  Hepatobil: LFTs ok.  Tbili wnl. Triglycerides elevated at 219 Neuro: AA&O. Pain score 0. ID:  Day #2 of abx for PNA. Afebrile, WBC wnl  Best Practices: SQ heparin TPN Access: PICC 8/4 >> TPN start date: 8/4 >>  Current Nutrition:  NPO (Patient was on carb modified diet from 7/26 to 7/29, then clears until 8/2, now NPO)  Nutritional Goals: per RD recs on 8/4 Kcal: 1700-1900 Protein: 80-95 g  Plan:  Change Clinimix to E 5/15 at 15ml/hr Continue 20% lipid emulsion at 54ml/hr Continue NS at 85 ml/hr TPN and IV lipid emulsion provides 48 g of protein and 1162 kCals per day  Will plan to titrate TPN to goal once CBGs and electrolytes stable Add MVI and TE in TPN Increase SSI to moderate and adjust as needed Monitor TPN labs, Bmet, Mg and Phos tomorrow F/U improvement in ileus / SBO, advancement in diet  Give Mg 2g IV x 1 today Give 67mmol of KPhos IV x 1 today (Gives ~17mEq of KCL)  Elenor Quinones, PharmD, [facility] Clinical Pharmacist Pager [phone] 02/27/2016 7:52 AM

## 2016-02-27 NOTE — Progress Notes (Signed)
PULMONARY / CRITICAL CARE MEDICINE   Name: Lindsey Jenkins MRN: [id number] DOB: [birth year]    ADMISSION DATE:  02/17/2016 CONSULTATION DATE:  02/26/16  REFERRING MD:  Greer Pickerel MD  CHIEF COMPLAINT:  Dyspnea, respiratory failure.  HISTORY OF PRESENT ILLNESS:   75 year old with past medical history of diabetes mellitus, diverticulosis, hypertension, GERD. Admitted for laparoscopic repair of ventral hernia on 7/26. Postoperative course significant for persistent tachycardia with elevated d-dimer. Cardiology consulted and she got a CTA which did not show any PE but bilateral consolidation consistent with pneumonia. She also has persistent ileus an NG tube placed for decompression. PCCM consulted for worsening dyspnea, hypoxia.  SUBJECTIVE:  Considerable abdominal pain. Says breathing better.   VITAL SIGNS: BP 136/75 (BP Location: Left Arm)   Pulse (!) 117   Temp 98.5 F (36.9 C) (Oral)   Resp 19   Ht 5\' 5"  (1.651 m)   Wt 72.6 kg (160 lb)   SpO2 98%   BMI 26.63 kg/m   HEMODYNAMICS:    VENTILATOR SETTINGS:    INTAKE / OUTPUT: I/O last 3 completed shifts: In: 3874.6 [P.O.:210; I.V.:3664.6] Out: 1300 [Urine:500; Emesis/NG output:800]  PHYSICAL EXAMINATION: General:  Elderly female, in mild distress. Neuro: Moves all 4 extremities, no focal deficits. HEENT:  No thyromegaly, JVD. Cardiovascular:  Tachycardia, regular rate, no murmurs rubs gallops. Lungs:  Clear to auscultation anteriorly, no wheeze, crackles, grunting, shallow Abdomen:  Distended, diminished bowel sounds, nontender, nondistended Musculoskeletal:  Normal tone and bulk Skin:  Intact  LABS:  BMET  Recent Labs Lab 02/25/16 0229 02/26/16 0413 02/27/16 0414  NA 135 141 139  K 5.4* 5.0 3.7  CL 108 111 112*  CO2 20* 16* 21*  BUN 25* 27* 12  CREATININE 1.54* 1.18* 0.79  GLUCOSE 98 110* 233*    Electrolytes  Recent Labs Lab 02/25/16 0229 02/26/16 0413 02/26/16 1125 02/27/16 0414  CALCIUM 8.7* 8.5*   --  8.6*  MG 2.0  --  2.5* 1.6*  PHOS  --   --  3.1 1.2*    CBC  Recent Labs Lab 02/24/16 1045 02/26/16 0729 02/27/16 0414  WBC 6.5 9.4 12.5*  HGB 12.0 11.5* 11.9*  HCT 36.1 35.4* 35.7*  PLT 369 297 272    Coag's No results for input(s): APTT, INR in the last 168 hours.  Sepsis Markers  Recent Labs Lab 02/26/16 1258  PROCALCITON 0.48    ABG No results for input(s): PHART, PCO2ART, PO2ART in the last 168 hours.  Liver Enzymes  Recent Labs Lab 02/27/16 0414  AST 21  ALT 26  ALKPHOS 55  BILITOT 0.6  ALBUMIN 2.1*    Cardiac Enzymes No results for input(s): TROPONINI, PROBNP in the last 168 hours.  Glucose  Recent Labs Lab 02/26/16 1221 02/26/16 1615 02/26/16 2003 02/27/16 0011 02/27/16 0345 02/27/16 0755  GLUCAP 138* 140* 160* 180* 217* 228*    Imaging Dg Abd Acute W/chest  Result Date: 02/26/2016 CLINICAL DATA:  Nine days postoperative from laparoscopic ventral hernia repair. Patient reports abdominal distention and increased pain over the past several days. Patient has had 2 bowel movements ; noisy breathing, productive cough. EXAM: DG ABDOMEN ACUTE W/ 1V CHEST COMPARISON:  CT scan of the chest of February 24, 2016 FINDINGS: There is persistent infiltrate in the inferior aspect of the right upper lobe. There small bilateral pleural effusions. There is bibasilar atelectasis or pneumonia. The heart and pulmonary vascularity are normal. There is calcification in the wall of the aortic arch.  There is an esophagogastric tube present whose tip lies in the gastric cardia. Within the abdomen there are moderately distended gas-filled loops of small bowel stacked in the midline. There is a small amount of stool and gas in the right colon. There is no rectal gas. No free extraluminal gas collections are observed. The bony structures exhibit no acute abnormalities. There degenerative changes of the lumbar spine with gentle dextrocurvature. IMPRESSION: 1. Inferior right  upper lobe pneumonia, bibasilar atelectasis and trace pleural effusions, stable. 2. Mid to distal small bowel obstruction without evidence of perforation. Electronically Signed   By: David  Martinique M.D.   On: 02/26/2016 08:46     STUDIES:  CTA 8/2 > no PE, consolidation in the right lower lobe, small bilateral effusions.  CULTURES: Bcx 8/4 > Sputum Cx 8/4  ANTIBIOTICS: Vanc 8/4>> Zosyn 8/4>>  SIGNIFICANT EVENTS: 7/26 > Laparoscopic repair of ventral hernia  LINES/TUBES: PICC 8/4 >  DISCUSSION: 75 year old with worsening dyspnea, hypoxia. Status post laparoscopic repair of ventral hernia 7/26. CT of the chest reviewed by me.  Problem- HCAP. Possible aspiration. Now with atelectasis, shallow breathing and somewhat labored breathing on O2, reflecting abdominal discomfort which limits cough. Cultures are pending/ Procalcitonin 0.48 baseline.  Recommend- Continue current antibiotics. PCXR ordered for 8/6. Marland Kitchen   ASSESSMENT / PLAN: Resp failure with hypoxia HCAP S/p repair of ventral hernia Ileus Tachycardia  - Continue vanco, zosyn - Following sputum and blood cultures - Checked procalciitonin - Continue incentive spirometry and flutter valve. - Mobilize as able - Continue supplemental O2.  We will continue to follow.  CD Annamaria Boots, MD [facility] Pager [phone] If no answer or after 3pm call: [phone] 02/27/2016, 7:59 AM

## 2016-02-28 ENCOUNTER — Inpatient Hospital Stay (HOSPITAL_COMMUNITY): Payer: Medicare Other

## 2016-02-28 DIAGNOSIS — J9601 Acute respiratory failure with hypoxia: Secondary | ICD-10-CM

## 2016-02-28 DIAGNOSIS — J189 Pneumonia, unspecified organism: Secondary | ICD-10-CM

## 2016-02-28 LAB — GLUCOSE, CAPILLARY
GLUCOSE-CAPILLARY: 167 mg/dL — AB (ref 65–99)
GLUCOSE-CAPILLARY: 240 mg/dL — AB (ref 65–99)
GLUCOSE-CAPILLARY: 263 mg/dL — AB (ref 65–99)
Glucose-Capillary: 221 mg/dL — ABNORMAL HIGH (ref 65–99)
Glucose-Capillary: 197 mg/dL — ABNORMAL HIGH (ref 65–99)
Glucose-Capillary: 164 mg/dL — ABNORMAL HIGH (ref 65–99)

## 2016-02-28 LAB — BLOOD GAS, ARTERIAL
Acid-base deficit: 1.3 mmol/L (ref 0.0–2.0)
BICARBONATE: 22.4 meq/L (ref 20.0–24.0)
Drawn by: 41977
O2 Content: 2 L/min
O2 Saturation: 92.6 %
PATIENT TEMPERATURE: 98.6
PCO2 ART: 33.9 mmHg — AB (ref 35.0–45.0)
PO2 ART: 63.5 mmHg — AB (ref 80.0–100.0)
TCO2: 23.4 mmol/L (ref 0–100)
pH, Arterial: 7.434 (ref 7.350–7.450)

## 2016-02-28 LAB — BASIC METABOLIC PANEL
ANION GAP: 6 (ref 5–15)
BUN: 6 mg/dL (ref 6–20)
CHLORIDE: 108 mmol/L (ref 101–111)
CO2: 23 mmol/L (ref 22–32)
Calcium: 8.7 mg/dL — ABNORMAL LOW (ref 8.9–10.3)
Creatinine, Ser: 0.67 mg/dL (ref 0.44–1.00)
GFR calc Af Amer: >60 mL/min (ref >60)
GLUCOSE: 210 mg/dL — AB (ref 65–99)
POTASSIUM: 3.4 mmol/L — AB (ref 3.5–5.1)
Sodium: 137 mmol/L (ref 135–145)

## 2016-02-28 LAB — CBC
HCT: 35.9 % — ABNORMAL LOW (ref 36.0–46.0)
Hemoglobin: 11.8 g/dL — ABNORMAL LOW (ref 12.0–15.0)
MCH: 25.3 pg — AB (ref 26.0–34.0)
MCHC: 32.9 g/dL (ref 30.0–36.0)
MCV: 77 fL — AB (ref 78.0–100.0)
PLATELETS: 257 10*3/uL (ref 150–400)
RBC: 4.66 MIL/uL (ref 3.87–5.11)
RDW: 14.7 % (ref 11.5–15.5)
WBC: 19.2 10*3/uL — AB (ref 4.0–10.5)

## 2016-02-28 LAB — PHOSPHORUS: Phosphorus: 2.1 mg/dL — ABNORMAL LOW (ref 2.5–4.6)

## 2016-02-28 LAB — MAGNESIUM: MAGNESIUM: 1.7 mg/dL (ref 1.7–2.4)

## 2016-02-28 LAB — PROCALCITONIN: PROCALCITONIN: 0.29 ng/mL

## 2016-02-28 MED ORDER — TRACE MINERALS CR-CU-MN-SE-ZN 10-1000-500-60 MCG/ML IV SOLN
INTRAVENOUS | Status: AC
  Administered 2016-02-28: 17:00:00 via INTRAVENOUS
  Filled 2016-02-28: qty 960

## 2016-02-28 MED ORDER — FAT EMULSION 20 % IV EMUL
240.0000 mL | INTRAVENOUS | Status: AC
  Administered 2016-02-28: 240 mL via INTRAVENOUS
  Filled 2016-02-28: qty 250

## 2016-02-28 MED ORDER — POTASSIUM PHOSPHATES 15 MMOLE/5ML IV SOLN
15.0000 mmol | Freq: Once | INTRAVENOUS | Status: AC
  Administered 2016-02-28: 15 mmol via INTRAVENOUS
  Filled 2016-02-28: qty 5

## 2016-02-28 MED ORDER — FUROSEMIDE 10 MG/ML IJ SOLN
20.0000 mg | Freq: Once | INTRAMUSCULAR | Status: AC
  Administered 2016-02-28: 20 mg via INTRAVENOUS
  Filled 2016-02-28: qty 2

## 2016-02-28 MED ORDER — MAGNESIUM SULFATE 2 GM/50ML IV SOLN
2.0000 g | Freq: Once | INTRAVENOUS | Status: AC
  Administered 2016-02-28: 2 g via INTRAVENOUS
  Filled 2016-02-28: qty 50

## 2016-02-28 MED ORDER — POTASSIUM CHLORIDE 10 MEQ/50ML IV SOLN
10.0000 meq | INTRAVENOUS | Status: AC
  Administered 2016-02-28 (×4): 10 meq via INTRAVENOUS
  Filled 2016-02-28 (×4): qty 50

## 2016-02-28 MED ORDER — LEVALBUTEROL HCL 0.63 MG/3ML IN NEBU
0.6300 mg | INHALATION_SOLUTION | RESPIRATORY_TRACT | Status: DC | PRN
  Administered 2016-02-28: 0.63 mg via RESPIRATORY_TRACT
  Filled 2016-02-28: qty 3

## 2016-02-28 NOTE — Progress Notes (Signed)
11 Days Post-Op  Subjective: Vents overnight noted, she has no sob this am, states no flatus, had some bms though, states pain is controlled  Objective: Vital signs in last 24 hours: Temp:  [98.6 F (37 C)-100 F (37.8 C)] 99.1 F (37.3 C) (08/06 0443) Pulse Rate:  [99-108] 106 (08/06 0443) Resp:  [17-18] 17 (08/06 0443) BP: (108-130)/(60-76) 130/76 (08/06 0443) SpO2:  [97 %-99 %] 97 % (08/06 0443) Last BM Date: 02/27/16  Intake/Output from previous day: 08/05 0701 - 08/06 0700 In: 2271.9 [P.O.:110; I.V.:1325.3; NG/GT:30; IV Piggyback:806.7] Out: 1700 [Urine:1500; Emesis/NG output:200] Intake/Output this shift: No intake/output data recorded.  General appearance: no distress Resp: clear to auscultation bilaterally Cardio: regular rate and rhythm GI: some bs mild tender incisions clean mild distention  Lab Results:   Recent Labs  02/27/16 0414 02/28/16 0510  WBC 12.5* 19.2*  HGB 11.9* 11.8*  HCT 35.7* 35.9*  PLT 272 257   BMET  Recent Labs  02/27/16 0414 02/28/16 0510  NA 139 137  K 3.7 3.4*  CL 112* 108  CO2 21* 23  GLUCOSE 233* 210*  BUN 12 6  CREATININE 0.79 0.67  CALCIUM 8.6* 8.7*   PT/INR No results for input(s): LABPROT, INR in the last 72 hours. ABG  Recent Labs  02/28/16 0302  PHART 7.434  HCO3 22.4    Studies/Results: Dg Chest Port 1 View  Result Date: 02/28/2016 CLINICAL DATA:  75 year old female pneumonia EXAM: PORTABLE CHEST 1 VIEW COMPARISON:  Chest radiograph dated 08/04 7 FINDINGS: Single portable view of the chest demonstrate interval improvement of the previously seen right lung opacity. There is residual infiltrate in the right infrahilar region. Left lung base atelectatic changes noted. There is no pleural effusion or pneumothorax. The cardiac silhouette is within normal limits. Right sided PICC with tip over right atrium. Enteric tube with tip in the mid stomach. IMPRESSION: Interval improvement of the right infrahilar opacity with  small residual infiltrate. Follow-up recommended. Electronically Signed   By: Anner Crete M.D.   On: 02/28/2016 03:21   Dg Abd Acute W/chest  Result Date: 02/26/2016 CLINICAL DATA:  Nine days postoperative from laparoscopic ventral hernia repair. Patient reports abdominal distention and increased pain over the past several days. Patient has had 2 bowel movements ; noisy breathing, productive cough. EXAM: DG ABDOMEN ACUTE W/ 1V CHEST COMPARISON:  CT scan of the chest of February 24, 2016 FINDINGS: There is persistent infiltrate in the inferior aspect of the right upper lobe. There small bilateral pleural effusions. There is bibasilar atelectasis or pneumonia. The heart and pulmonary vascularity are normal. There is calcification in the wall of the aortic arch. There is an esophagogastric tube present whose tip lies in the gastric cardia. Within the abdomen there are moderately distended gas-filled loops of small bowel stacked in the midline. There is a small amount of stool and gas in the right colon. There is no rectal gas. No free extraluminal gas collections are observed. The bony structures exhibit no acute abnormalities. There degenerative changes of the lumbar spine with gentle dextrocurvature. IMPRESSION: 1. Inferior right upper lobe pneumonia, bibasilar atelectasis and trace pleural effusions, stable. 2. Mid to distal small bowel obstruction without evidence of perforation. Electronically Signed   By: David  Martinique M.D.   On: 02/26/2016 08:46    Anti-infectives: Anti-infectives    Start     Dose/Rate Route Frequency Ordered Stop   02/27/16 0100  vancomycin (VANCOCIN) IVPB 750 mg/150 ml premix  750 mg 150 mL/hr over 60 Minutes Intravenous Every 12 hours 02/26/16 1233     02/26/16 1300  vancomycin (VANCOCIN) IVPB 1000 mg/200 mL premix     1,000 mg 200 mL/hr over 60 Minutes Intravenous  Once 02/26/16 1233 02/26/16 1522   02/26/16 1300  piperacillin-tazobactam (ZOSYN) IVPB 3.375 g     3.375  g 12.5 mL/hr over 240 Minutes Intravenous Every 8 hours 02/26/16 1233     02/17/16 0800  ceFAZolin (ANCEF) IVPB 2g/100 mL premix     2 g 200 mL/hr over 30 Minutes Intravenous To [facility] 02/16/16 1059 02/17/16 0829      Assessment/Plan: POD 11 lap vh repair Postop ileus -  NG placement 02/24/16, she has somewhat concerning ct scan on the second without really return of bowel function, needs follow up xrays today may need further imaging, this could also certainly be related to her pna but this appears better now.   Pneumonia  -   RUL/basilar atelectasis and trace effusion, appreciate pulm consult Malnutrition  -  pre-albumin 4.0 on 02/27/16, continue tna FEN: Nothing by mouth ID: Day 3 of vancomycin and Zosyn. DVT: SCDs/heparin     [facility] 02/28/2016

## 2016-02-28 NOTE — Progress Notes (Signed)
Physical Therapy Treatment Patient Details Name: Lindsey Jenkins MRN: [id number] DOB: 1941/01/15 Today's Date: 02/28/2016    History of Present Illness Pt is a 75 y/o female s/p ventral hernis repair. Hospital course complicated by ileus. PMH including but not limited to DM and HTN.    PT Comments    Pt with respiratory distress last night requiring rapid response. Pt was encouraged to accept pain meds to assist with managing pain. Pt demo good improvement with mobility today with increased gait distance and improved vitals. Decreased work of breathing noted with mobility as compared to previous sessions.   Follow Up Recommendations  Supervision for mobility/OOB     Equipment Recommendations  None recommended by PT    Recommendations for Other Services       Precautions / Restrictions Precautions Precautions: Fall;Other (comment) Precaution Comments: NG tube    Mobility  Bed Mobility     Rolling: Supervision Sidelying to sit: Supervision       General bed mobility comments: +rail  Transfers   Equipment used: Rolling walker (2 wheeled)   Sit to Stand: Min guard         General transfer comment: Pt performed with min guard for safety, no assist needed.    Ambulation/Gait Ambulation/Gait assistance: Min guard;+2 safety/equipment Ambulation Distance (Feet): 200 Feet Assistive device: Rolling walker (2 wheeled) Gait Pattern/deviations: Step-through pattern;Decreased stride length Gait velocity: mildly decreased Gait velocity interpretation: Below normal speed for age/gender General Gait Details: Pt ambulated on 4 L O2. O2 sats decreased to 88% but quickly recovered to > or = 96% with verbal cues for deep breaths through nose. No rest breaks needed. HR max of 123. DOE 2/4.    Stairs            Wheelchair Mobility    Modified Rankin (Stroke Patients Only)       Balance   Sitting-balance support: Feet supported;No upper extremity supported Sitting  balance-Leahy Scale: Good     Standing balance support: During functional activity;Bilateral upper extremity supported Standing balance-Leahy Scale: Fair                      Cognition Arousal/Alertness: Awake/alert Behavior During Therapy: WFL for tasks assessed/performed Overall Cognitive Status: Within Functional Limits for tasks assessed                      Exercises      General Comments        Pertinent Vitals/Pain Pain Score: 2  Pain Location: abdomen Pain Descriptors / Indicators: Sore Pain Intervention(s): Monitored during session;Premedicated before session;Repositioned    Home Living                      Prior Function            PT Goals (current goals can now be found in the care plan section) Acute Rehab PT Goals Patient Stated Goal: return home PT Goal Formulation: With patient Time For Goal Achievement: 02/29/16 Potential to Achieve Goals: Good Progress towards PT goals: Progressing toward goals    Frequency  Min 3X/week    PT Plan Current plan remains appropriate    Co-evaluation             End of Session Equipment Utilized During Treatment: Gait belt;Oxygen Activity Tolerance: Patient tolerated treatment well Patient left: in chair;with call bell/phone within reach;Other (comment)     [id number] PT Time Calculation (min) (ACUTE  ONLY): 24 min  Charges:  $Gait Training: 23-37 mins                    G Codes:      Lorriane Shire 02/28/2016, 9:52 AM

## 2016-02-28 NOTE — Progress Notes (Signed)
[place] Progress Note Patient Name: ARPI FILARDI DOB: 1940/10/08 MRN: [id number]  Call from rapid response RN and bedside RN  - worsening tachypnea  - bedside CCM MD Dr Clarise Cruz to bedside toe val  Intervention Category Major Interventions: Respiratory failure - evaluation and management  Loistine Eberlin 02/28/2016, 3:18 AM

## 2016-02-28 NOTE — Progress Notes (Signed)
PULMONARY / CRITICAL CARE MEDICINE   Name: Lindsey Jenkins MRN: [id number] DOB: 06/24/1941    ADMISSION DATE:  02/17/2016 CONSULTATION DATE:  02/26/16  REFERRING MD:  Greer Pickerel MD  CHIEF COMPLAINT:  Dyspnea, respiratory failure.  HISTORY OF PRESENT ILLNESS:   75 year old with past medical history of diabetes mellitus, diverticulosis, hypertension, GERD. Admitted for laparoscopic repair of ventral hernia on 7/26. Postoperative course significant for persistent tachycardia with elevated d-dimer. Cardiology consulted and she got a CTA which did not show any PE but bilateral consolidation consistent with pneumonia. She also has persistent ileus an NG tube placed for decompression. PCCM consulted for worsening dyspnea, hypoxia.  SUBJECTIVE:  Rapid Response and PCCM on call to bedside last night for resp distress. Gave lasix, continued abx, advised more pain control.This AM pt says she is "breathing a whole lot better" and says abd pain better.   VITAL SIGNS: BP 130/76   Pulse (!) 106   Temp 99.1 F (37.3 C)   Resp 17   Ht 5\' 5"  (1.651 m)   Wt 72.6 kg (160 lb)   SpO2 97%   BMI 26.63 kg/m   HEMODYNAMICS:    VENTILATOR SETTINGS:    INTAKE / OUTPUT: I/O last 3 completed shifts: In: 3861.5 [P.O.:260; I.V.:2764.9; NG/GT:30; IV Piggyback:806.7] Out: 2600 [Urine:2000; Emesis/NG output:600]  PHYSICAL EXAMINATION: General:  Elderly female, in mild distress.Conversant Neuro: Moves all 4 extremities, no focal deficits. HEENT:  No thyromegaly, JVD. Cardiovascular:  Tachycardia, regular rate, no murmurs rubs gallops. Lungs:  Clear to auscultation anteriorly, binder covers lower chest, no wheeze, +few crackles, shallow Abdomen:  Distended, diminished bowel sounds,  Musculoskeletal:  Normal tone and bulk Skin:  Intact  LABS:  BMET  Recent Labs Lab 02/26/16 0413 02/27/16 0414 02/28/16 0510  NA 141 139 137  K 5.0 3.7 3.4*  CL 111 112* 108  CO2 16* 21* 23  BUN 27* 12 6   CREATININE 1.18* 0.79 0.67  GLUCOSE 110* 233* 210*    Electrolytes  Recent Labs Lab 02/26/16 0413 02/26/16 1125 02/27/16 0414 02/28/16 0510  CALCIUM 8.5*  --  8.6* 8.7*  MG  --  2.5* 1.6* 1.7  PHOS  --  3.1 1.2* 2.1*    CBC  Recent Labs Lab 02/26/16 0729 02/27/16 0414 02/28/16 0510  WBC 9.4 12.5* 19.2*  HGB 11.5* 11.9* 11.8*  HCT 35.4* 35.7* 35.9*  PLT 297 272 257    Coag's No results for input(s): APTT, INR in the last 168 hours.  Sepsis Markers  Recent Labs Lab 02/26/16 1258 02/28/16 0510  PROCALCITON 0.48 0.29    ABG  Recent Labs Lab 02/28/16 0302  PHART 7.434  PCO2ART 33.9*  PO2ART 63.5*    Liver Enzymes  Recent Labs Lab 02/27/16 0414  AST 21  ALT 26  ALKPHOS 55  BILITOT 0.6  ALBUMIN 2.1*    Cardiac Enzymes No results for input(s): TROPONINI, PROBNP in the last 168 hours.  Glucose  Recent Labs Lab 02/27/16 1133 02/27/16 1623 02/27/16 2005 02/28/16 0016 02/28/16 0422 02/28/16 0747  GLUCAP 231* 249* 302* 167* 221* 240*    Imaging Dg Chest Port 1 View  Result Date: 02/28/2016 CLINICAL DATA:  75 year old female pneumonia EXAM: PORTABLE CHEST 1 VIEW COMPARISON:  Chest radiograph dated 08/04 7 FINDINGS: Single portable view of the chest demonstrate interval improvement of the previously seen right lung opacity. There is residual infiltrate in the right infrahilar region. Left lung base atelectatic changes noted. There is no pleural effusion or  pneumothorax. The cardiac silhouette is within normal limits. Right sided PICC with tip over right atrium. Enteric tube with tip in the mid stomach. IMPRESSION: Interval improvement of the right infrahilar opacity with small residual infiltrate. Follow-up recommended. Electronically Signed   By: Anner Crete M.D.   On: 02/28/2016 03:21   I reviewed CXR image 8/6 and agree w report  STUDIES:  CTA 8/2 > no PE, consolidation in the right lower lobe, small bilateral  effusions.  CULTURES: Bcx 8/4 > Sputum Cx 8/4  ANTIBIOTICS: Vanc 8/4>> Zosyn 8/4>>  SIGNIFICANT EVENTS: 7/26 > Laparoscopic repair of ventral hernia  LINES/TUBES: PICC 8/4 >  DISCUSSION: 75 year old with worsening dyspnea, hypoxia. Status post laparoscopic repair of ventral hernia 7/26.   ASSESSMENT / PLAN: Resp failure with hypoxia- Sats this AM 97% nasal prongs HCAP- CXR shows improving infiltrate RLL, but increasing leukocytosis on Vanc/Zosyn, BldCx neg to date S/p repair of ventral hernia- substantial pain control needs and hypoventilation  Ileus Tachycardia  - Continue vanco, zosyn - Following sputum and blood cultures - Checked procalciitonin - Continue incentive spirometry and flutter valve. - Mobilize as able - Continue supplemental O2.  We will continue to follow.  CD Annamaria Boots, MD [facility] Pager [phone] If no answer or after 3pm call: [phone] 02/28/2016, 8:07 AM

## 2016-02-28 NOTE — Progress Notes (Signed)
RT called to patient bedside for tachypnea. RT ordered PRN xopenex 0.63. Treatment given with minimal improvement. Rapid response and RN also at bedside with patient.

## 2016-02-28 NOTE — Progress Notes (Signed)
PARENTERAL NUTRITION CONSULT NOTE - INITIAL  Pharmacy Consult for TPN Indication: Prolonged Ileus / SBO  Allergies  Allergen Reactions  . No Known Allergies     Patient Measurements: Height: 5\' 5"  (165.1 cm) Weight: 160 lb (72.6 kg) IBW/kg (Calculated) : 57 Adjusted Body Weight: 61.7 kg  Vital Signs: Temp: 99.1 F (37.3 C) (08/06 0443) Temp Source: Oral (08/05 2240) BP: 130/76 (08/06 0443) Pulse Rate: 106 (08/06 0443) Intake/Output from previous day: 08/05 0701 - 08/06 0700 In: 2271.9 [P.O.:110; I.V.:1325.3; NG/GT:30; IV Piggyback:806.7] Out: 1700 [Urine:1500; Emesis/NG output:200] Intake/Output from this shift: No intake/output data recorded.  Labs:  Recent Labs  02/26/16 0729 02/27/16 0414 02/28/16 0510  WBC 9.4 12.5* 19.2*  HGB 11.5* 11.9* 11.8*  HCT 35.4* 35.7* 35.9*  PLT 297 272 257     Recent Labs  02/26/16 0413 02/26/16 1125 02/27/16 0414 02/28/16 0510  NA 141  --  139 137  K 5.0  --  3.7 3.4*  CL 111  --  112* 108  CO2 16*  --  21* 23  GLUCOSE 110*  --  233* 210*  BUN 27*  --  12 6  CREATININE 1.18*  --  0.79 0.67  CALCIUM 8.5*  --  8.6* 8.7*  MG  --  2.5* 1.6* 1.7  PHOS  --  3.1 1.2* 2.1*  PROT  --   --  5.1*  --   ALBUMIN  --   --  2.1*  --   AST  --   --  21  --   ALT  --   --  26  --   ALKPHOS  --   --  55  --   BILITOT  --   --  0.6  --   PREALBUMIN  --   --  4.0*  --   TRIG  --   --  219*  --    Estimated Creatinine Clearance: 63.4 mL/min (by C-G formula based on SCr of 0.8 mg/dL).    Recent Labs  02/28/16 0016 02/28/16 0422 02/28/16 0747  GLUCAP 167* 221* 240*    Medical History: Past Medical History:  Diagnosis Date  . Arthritis    hands, feet, ? type of arthritis, no treatment yet   . BACK PAIN, LUMBAR 05/21/2009   pt. reports bulging disc in lumbar region, since 2015  . Cataracts, bilateral   . 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 DIABETES MELLITUS, TYPE II 04/19/2007  . DIVERTICULOSIS, COLON 07/20/2006  .  GASTROESOPHAGEAL REFLUX DISEASE 08/21/2007  . HIP PAIN 05/24/2010  . HYPERLIPIDEMIA 04/19/2007  . HYPERTENSION 04/19/2007  . MENOPAUSAL SYNDROME 04/19/2007  . OSTEOPOROSIS 04/19/2007    Medications:  Scheduled:  . antiseptic oral rinse  7 mL Mouth Rinse BID  . heparin  5,000 Units Subcutaneous Q8H  . insulin aspart  0-15 Units Subcutaneous Q4H  . metoprolol  5 mg Intravenous Q4H  . piperacillin-tazobactam (ZOSYN)  IV  3.375 g Intravenous Q8H  . sodium chloride flush  10-40 mL Intracatheter Q12H  . vancomycin  750 mg Intravenous Q12H   Infusions:  . sodium chloride 85 mL/hr at 02/28/16 0006  . Marland KitchenTPN (CLINIMIX-E) Adult 40 mL/hr at 02/27/16 1807   And  . fat emulsion 240 mL (02/27/16 1807)    Insulin Requirements in the past 24 hours:  32 units of moderate SSI  Assessment: 75 year old female s/p laparoscopic ventral hernia repair with mesh insertion on 7/26. Patient had good appetite PTA but intake has  been minimal since surgery. Now 9 days post-op with ileus and now mid-distal SBO without perforation on abdominal film without response to bowel rest since 8/2 and NG tube to LIWS to have PICC inserted and start TPN on 8/4.   Surgeries/Procedures: 7/26 Laparoscopic ventral hernia repair with mesh insertion  GI: Patient has Ileus/SBO on Abd film. Patient reports persistent emesis and bowel distension so NG tube placed. NG tube output was down to200 ml yesterday. Surgery may try clamping NG tube today. Still reports abdominal pain but has improved per patient. Has had several BMs over last couple of days. Albumin low at 2.1. Endo: CBGs continued to trend up despite going to moderate SSI yesterday (160-300s) Lytes:Lytes wnl exc K dropped further to 3.4 (goal > 4 with ileus) Phos remains low but up to 2.1 and Mg low at 1.7 (goal > 2 with ileus) after replacing K, Phos, and Mg yesterday. Will plan to replace again today. CoCa 10.1. NS at 34ml/hr.  Renal: SCr trending down and is now stable, CrCl  ~50ml/min. UOP up to 0.9cc/kg/hr.  Pulm: Placed back on O2 today at 2L [place]. Patient had episode of shallowed breathing this am and rapid reponse called; after breathing treatment seems to be doing much better. Cards: Lopressor IV q6h - ST (episode of SVT 8/3 PM). BP ok. 8/2 TTE-EF 65%, PAP 40.  Hepatobil: LFTs ok. Tbili wnl. Triglycerides elevated at 219 Neuro: AA&O. Pain score 0. ID:  Day #3 of abx for PNA. Last Xray shows improvement in infiltrates. Afebrile, WBC wnl  Best Practices: SQ heparin TPN Access: PICC 8/4 >> TPN start date: 8/4 >>  Current Nutrition:  NPO (Patient was on carb modified diet from 7/26 to 7/29, then clears until 8/2, now NPO) Clinimix E 5/15 at 84ml/hr and IV fat emulsions at 77ml/hr  Nutritional Goals: per RD recs on 8/4 Kcal: 1700-1900 Protein: 80-95 g  Plan:  Continue Clinimix E 5/15 at 70ml/hr Continue 20% lipid emulsion at 69ml/hr Continue NS at 85 ml/hr TPN and IV lipid emulsion provides 48 g of protein and 1162 kCals per day  Will plan to titrate TPN to goal once CBGs and electrolytes stable Add MVI and TE in TPN Add 20 units of regular insulin to TPN Continue moderate SSI and adjust as needed Monitor TPN labs, Mg and Phos tomorrow F/U improvement in ileus / SBO, advancement in diet  Give another Mg 2g IV x 1 today Give another KPhos 11mmol x 1 today (Gives ~70mEq of K) Then give extra 75mEq of KCl IV x 4 runs  Elenor Quinones, PharmD, [facility] Clinical Pharmacist Pager [phone] 02/28/2016 8:39 AM

## 2016-02-28 NOTE — Progress Notes (Signed)
Abd. Binder off, pt walked in hall, sitting up in chair.

## 2016-02-28 NOTE — Progress Notes (Signed)
Called to bedside for respiratory distress.   Briefly, Lindsey Jenkins is a 12F POD 11 after laparoscopic ventral hernia repair with persistent abdominal pain and distension, HCAP, clinical signs of volume overload and hypoxemic respiratory failure on 2L [place].   On my arrival, patient sitting reclined in bed, NGT to low intermittent suction with ~200out since shift change, mildly increased work of breathing and RR ~28. Endorses abdominal pain. Breathing feels better after Xopenex treatment. Nursing staff giving morphine for pain.   Chest clear on anterior exam. Bibasilar rales on posterior exam. No wheezes or ronchi.  Heart tones w/ mild tachycardia, s1/s1, no M/R/G.  Abdomen distended, tight, tender, + BS Extremities with 1+ bipedal edema  Labs show normal renal function. ABG 7.434 / 33.0 / 63.5 / 22.4. CBC w/ WBC 12.5 / Hgb 11.9 / PLT 272.   Assessment:  Acute hypoxemic respiratory failure 2/2 HCAP, poorly controlled postoperative pain with abdominal distension and poor diaphragmatic excursion resulting in shallow breathing, and volume overload.   Case discussed with nursing staff (bedside nurse and rapid response): we are all in agreement that she is safe to remain on the floor.  Recommendations:  She needs more aggressive pain control - continue morphine; patient counseled on importance of staying ahead of the pain  Continue supplemental oxygen for sats > 92%  Continue IS and pulmonary toilet  Encourage ambulation  Continue antibiotics for HCAP  D/c maintenance fluids for now  Lasix 20mg  iv x 1  Yisroel Ramming, MD Pulmonary and Critical Care 02/28/16 3:54 AM

## 2016-02-28 NOTE — Significant Event (Signed)
Rapid Response Event Note RN called for increase WOB   Overview: Time Called: [id number] Arrival Time: 0237 Event Type: Respiratory  Initial Focused Assessment: Pt with shallow respirations of 22-24/minute, abd discomfort and distention, tender to touch, BS present,  lungs clear through out, skin warm and dry, abd binder present for recent laparoscopic ventral hernia repair. +2 edema to BLE. 100% 2L, NGT to LWS, 100 cc output since 2300.   Interventions: Breathing tx given, ABG obtained, morning CXR placed as STAT. Auscultated Confirmation NGT placement x2 RN's. Morphine 2mg  IVP given. Pt not taking pain medication as needed. Pt reports pain 5/10, pt not controlling pain with PRN doses. Pt encouraged and educated on the use of PRN pain medication. Pt states it hurts to take in a deep breath. Pain 2/10 Plan of Care (if not transferred): Continue to monitor patient and stay ahead  pain control. PLan of care discussed with Koko RN  Event Summary: Name of Physician Notified: Dr. Chase Caller  at [phone]  Name of Consulting Physician Notified: Dr. Hulen Skains  at [phone]  Outcome: Stayed in room and stabalized  Event End Time: 0352  Armen Pickup

## 2016-02-28 NOTE — Progress Notes (Addendum)
Patient Name: Lindsey Jenkins Date of Encounter: 02/28/2016  Active Problems:   Ventral hernia without obstruction or gangrene   Tachycardia with 121 - 140 beats per minute   HCAP (healthcare-associated pneumonia)    Patient Profile: 75 yo female with PMH of HTN, HLD, and DMII, but no past cardiac history presented with ventral hernia repair, cardiology consulted post op for persistent tachycardia. D-dimer elevated at 19.32. TSH also suppressed at 0.277.   SUBJECTIVE: Has no significant in abdomen now. Denies chest pain. Does feel SOB. "Never had a problem with my heart before". Just came back from abdominal film.   OBJECTIVE Vitals:   02/27/16 1351 02/27/16 2240 02/28/16 0245 02/28/16 0443  BP: 120/69 108/60  130/76  Pulse: 99 (!) 108  (!) 106  Resp: 18 18  17   Temp: 98.6 F (37 C) 100 F (37.8 C)  99.1 F (37.3 C)  TempSrc: Oral Oral    SpO2: 98% 98% 99% 97%  Weight:      Height:        Filed Weights   02/17/16 0639 02/21/16 1800  Weight: 160 lb (72.6 kg) 160 lb (72.6 kg)    PHYSICAL EXAM General: Well developed, well nourished, female in no acute distress, mildly ill appearing.  Head: Normocephalic, atraumatic. NGT, TPN Neck: Supple without bruits, no JVD. Lungs:  Resp regular and mildly labored, CTA. Heart: Tachy reg, occasional ectopy. S1, S2, no S3, S4, or murmur; no rub. Abdomen: Soft, non-tender, non-distended, BS + x 4.  Extremities: No clubbing, cyanosis,no edema.  Neuro: Alert and oriented X 3. Moves all extremities spontaneously. Psych: Normal affect.  LABS: CBC:  Recent Labs  02/26/16 0729 02/27/16 0414 02/28/16 0510  WBC 9.4 12.5* 19.2*  NEUTROABS 6.1  --   --   HGB 11.5* 11.9* 11.8*  HCT 35.4* 35.7* 35.9*  MCV 78.3 77.1* 77.0*  PLT 297 272 [id number]   Basic Metabolic Panel:  Recent Labs  02/27/16 0414 02/28/16 0510  NA 139 137  K 3.7 3.4*  CL 112* 108  CO2 21* 23  GLUCOSE 233* 210*  BUN 12 6  CREATININE 0.79 0.67  CALCIUM 8.6* 8.7*    MG 1.6* 1.7  PHOS 1.2* 2.1*    No results for input(s): TSH, T4TOTAL, T3FREE, THYROIDAB in the last 72 hours.  Invalid input(s): FREET3 . Marland KitchenTPN (CLINIMIX-E) Adult 40 mL/hr at 02/27/16 1807   And  . fat emulsion 240 mL (02/27/16 1807)  . Marland KitchenTPN (CLINIMIX-E) Adult     And  . fat emulsion      Scheduled Meds: . antiseptic oral rinse  7 mL Mouth Rinse BID  . heparin  5,000 Units Subcutaneous Q8H  . insulin aspart  0-15 Units Subcutaneous Q4H  . metoprolol  5 mg Intravenous Q4H  . piperacillin-tazobactam (ZOSYN)  IV  3.375 g Intravenous Q8H  . potassium chloride  10 mEq Intravenous Q1 Hr x 4  . potassium phosphate IVPB (mmol)  15 mmol Intravenous Once  . sodium chloride flush  10-40 mL Intracatheter Q12H  . vancomycin  750 mg Intravenous Q12H   Continuous Infusions: . Marland KitchenTPN (CLINIMIX-E) Adult 40 mL/hr at 02/27/16 1807   And  . fat emulsion 240 mL (02/27/16 1807)  . Marland KitchenTPN (CLINIMIX-E) Adult     And  . fat emulsion     PRN Meds:.levalbuterol, menthol-cetylpyridinium, methocarbamol, morphine injection, ondansetron **OR** ondansetron (ZOFRAN) IV, sodium chloride flush  TELE:  Sinus tach 110-120's.  ECHO Study Conclusions  - Left ventricle: The cavity size was normal. Wall thickness was increased in a pattern of moderate LVH. Systolic function was vigorous. The estimated ejection fraction was in the range of 65% to 70%. Wall motion was normal; there were no regional wall motion abnormalities. Doppler parameters are consistent with abnormal left ventricular relaxation (grade 1 diastolic dysfunction). - Pulmonary arteries: PA peak pressure: 40 mm Hg (S). Right ventricle: The cavity size was normal. Wall thickness was normal. Systolic function was normal. Right atrium: The atrium was normal in size.  Radiology/Studies: Dg Chest Port 1 View  Result Date: 02/28/2016 CLINICAL DATA:  75 year old female pneumonia EXAM: PORTABLE CHEST 1 VIEW COMPARISON:  Chest  radiograph dated 08/04 7 FINDINGS: Single portable view of the chest demonstrate interval improvement of the previously seen right lung opacity. There is residual infiltrate in the right infrahilar region. Left lung base atelectatic changes noted. There is no pleural effusion or pneumothorax. The cardiac silhouette is within normal limits. Right sided PICC with tip over right atrium. Enteric tube with tip in the mid stomach. IMPRESSION: Interval improvement of the right infrahilar opacity with small residual infiltrate. Follow-up recommended. Electronically Signed   By: Anner Crete M.D.   On: 02/28/2016 03:21   Dg Abd 2 Views  Result Date: 02/28/2016 CLINICAL DATA:  Status post ventral abdominal wall hernia repair EXAM: ABDOMEN - 2 VIEW COMPARISON:  02/26/2016 FINDINGS: The nasogastric tube is looped in the stomach. The tip is in the projection of the distal stomach. Persistent gaseous distension of the small and large bowel loops are identified. The small bowel loops measure up to 5.6 cm. There has been mild increase in colonic gas. IMPRESSION: 1. Persistent small bowel obstruction pattern. There is been increase in colonic gas up to the level of the rectum which suggests mild interval improvement. Electronically Signed   By: Kerby Moors M.D.   On: 02/28/2016 11:22     Current Medications:  . antiseptic oral rinse  7 mL Mouth Rinse BID  . heparin  5,000 Units Subcutaneous Q8H  . insulin aspart  0-15 Units Subcutaneous Q4H  . metoprolol  5 mg Intravenous Q4H  . piperacillin-tazobactam (ZOSYN)  IV  3.375 g Intravenous Q8H  . potassium chloride  10 mEq Intravenous Q1 Hr x 4  . potassium phosphate IVPB (mmol)  15 mmol Intravenous Once  . sodium chloride flush  10-40 mL Intracatheter Q12H  . vancomycin  750 mg Intravenous Q12H   . Marland KitchenTPN (CLINIMIX-E) Adult 40 mL/hr at 02/27/16 1807   And  . fat emulsion 240 mL (02/27/16 1807)  . Marland KitchenTPN (CLINIMIX-E) Adult     And  . fat emulsion       ASSESSMENT AND PLAN: Active Problems:   Ventral hernia without obstruction or gangrene   Tachycardia with 121 - 140 beats per minute   HCAP (healthcare-associated pneumonia)  1. Persistent tachycardia:  In the setting of recent surgery/ pain post surgery, Elevated D-dimer of 19 and suppressed TSH.  No CP nor palpitations.   2D echo shows vigorous LV systolic function with EF of 65-70%. Normal RA size and RV function.  No PE on CT but bilateral airspace consolidation ? c/w pneumonia, on abx.  Metoprolol changed to iv dosing 5mg  Q4 with bowel obstruction and oral absorption difficulty;  Will continue to hold nifedipine.   HR improved 99 on tele now improved with increased frequency metoprolol 5mg  IV Q4.    No changes. Occasional PVC's couplets noted.  No sustained VT.  2. TSH mildly low: TSH 0.277, but free T4 nl and low free T3; doubt hyperthyroidism .  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 Consider endo eval.                                                       3. Ventral hernia s/p repair:  Partial small bowel obstruction on CT  4. AKI:  resolved. Holding lisinopril   5. HTN: stable in 100 - 114 range  6. HLD: on zocor    7. DM II: on Amaryl and metformin at home  8. Lung consolidation: Afebrile. Pulmonary toilet. Pulmonary following.    Candee Furbish, MD  02/28/2016 11:30 AM

## 2016-02-29 DIAGNOSIS — R140 Abdominal distension (gaseous): Secondary | ICD-10-CM

## 2016-02-29 DIAGNOSIS — J189 Pneumonia, unspecified organism: Secondary | ICD-10-CM

## 2016-02-29 DIAGNOSIS — J181 Lobar pneumonia, unspecified organism: Secondary | ICD-10-CM

## 2016-02-29 LAB — COMPREHENSIVE METABOLIC PANEL
ALT: 26 U/L (ref 14–54)
AST: 28 U/L (ref 15–41)
Albumin: 1.8 g/dL — ABNORMAL LOW (ref 3.5–5.0)
Alkaline Phosphatase: 63 U/L (ref 38–126)
Anion gap: 5 (ref 5–15)
CHLORIDE: 104 mmol/L (ref 101–111)
CO2: 29 mmol/L (ref 22–32)
CREATININE: 0.71 mg/dL (ref 0.44–1.00)
Calcium: 9 mg/dL (ref 8.9–10.3)
GFR calc Af Amer: >60 mL/min (ref >60)
GFR calc non Af Amer: >60 mL/min (ref >60)
GLUCOSE: 213 mg/dL — AB (ref 65–99)
POTASSIUM: 3.7 mmol/L (ref 3.5–5.1)
SODIUM: 138 mmol/L (ref 135–145)
Total Bilirubin: 0.5 mg/dL (ref 0.3–1.2)
Total Protein: 5 g/dL — ABNORMAL LOW (ref 6.5–8.1)

## 2016-02-29 LAB — CBC
HEMATOCRIT: 34.8 % — AB (ref 36.0–46.0)
HEMOGLOBIN: 11.4 g/dL — AB (ref 12.0–15.0)
MCH: 25.7 pg — ABNORMAL LOW (ref 26.0–34.0)
MCHC: 32.8 g/dL (ref 30.0–36.0)
MCV: 78.4 fL (ref 78.0–100.0)
Platelets: 227 10*3/uL (ref 150–400)
RBC: 4.44 MIL/uL (ref 3.87–5.11)
RDW: 14.9 % (ref 11.5–15.5)
WBC: 18.9 10*3/uL — AB (ref 4.0–10.5)

## 2016-02-29 LAB — TRIGLYCERIDES: Triglycerides: 169 mg/dL — ABNORMAL HIGH (ref <150)

## 2016-02-29 LAB — GLUCOSE, CAPILLARY
GLUCOSE-CAPILLARY: 202 mg/dL — AB (ref 65–99)
GLUCOSE-CAPILLARY: 176 mg/dL — AB (ref 65–99)
GLUCOSE-CAPILLARY: 246 mg/dL — AB (ref 65–99)
GLUCOSE-CAPILLARY: 173 mg/dL — AB (ref 65–99)
Glucose-Capillary: 166 mg/dL — ABNORMAL HIGH (ref 65–99)
Glucose-Capillary: 173 mg/dL — ABNORMAL HIGH (ref 65–99)

## 2016-02-29 LAB — PREALBUMIN: PREALBUMIN: 4.2 mg/dL — AB (ref 18–38)

## 2016-02-29 LAB — MAGNESIUM: Magnesium: 1.6 mg/dL — ABNORMAL LOW (ref 1.7–2.4)

## 2016-02-29 LAB — PHOSPHORUS: Phosphorus: 3.2 mg/dL (ref 2.5–4.6)

## 2016-02-29 MED ORDER — POTASSIUM CHLORIDE 10 MEQ/50ML IV SOLN
10.0000 meq | INTRAVENOUS | Status: AC
  Administered 2016-02-29 (×3): 10 meq via INTRAVENOUS
  Filled 2016-02-29 (×3): qty 50

## 2016-02-29 MED ORDER — FAT EMULSION 20 % IV EMUL
240.0000 mL | INTRAVENOUS | Status: AC
  Administered 2016-02-29: 240 mL via INTRAVENOUS
  Filled 2016-02-29: qty 250

## 2016-02-29 MED ORDER — DEXTROSE 5 % IV SOLN
1.0000 g | INTRAVENOUS | Status: DC
  Administered 2016-02-29: 1 g via INTRAVENOUS
  Filled 2016-02-29 (×2): qty 10

## 2016-02-29 MED ORDER — MAGNESIUM SULFATE 2 GM/50ML IV SOLN
2.0000 g | Freq: Once | INTRAVENOUS | Status: AC
  Administered 2016-02-29: 2 g via INTRAVENOUS
  Filled 2016-02-29: qty 50

## 2016-02-29 MED ORDER — TRACE MINERALS CR-CU-MN-SE-ZN 10-1000-500-60 MCG/ML IV SOLN
INTRAVENOUS | Status: AC
  Administered 2016-02-29: 18:00:00 via INTRAVENOUS
  Filled 2016-02-29: qty 960

## 2016-02-29 NOTE — Progress Notes (Signed)
Physical Therapy Treatment Patient Details Name: Lindsey Jenkins MRN: [id number] DOB: 12/01/1940 Today's Date: 02/29/2016    History of Present Illness Pt is a 75 y/o female s/p ventral hernis repair. Hospital course complicated by ileus and PNA. PMH including but not limited to DM and HTN.    PT Comments    Pt progressing well performing today's tx without supplemental O2.  Pt remains to require cues for safety and fatigues with mobility.  Will continue to follow patient in acute setting to progress mobility.    Follow Up Recommendations  Supervision for mobility/OOB     Equipment Recommendations  None recommended by PT    Recommendations for Other Services       Precautions / Restrictions Precautions Precautions: Fall;Other (comment) Precaution Comments: NG tube, watch HR and O2 sats. Required Braces or Orthoses:  (abdominal binder. ) Restrictions Weight Bearing Restrictions: No    Mobility  Bed Mobility Overal bed mobility: Needs Assistance Bed Mobility: Supine to Sit Rolling: Min guard (to place abdominal binder.  )   Supine to sit: Min guard Sit to supine: Min guard   General bed mobility comments: Cues for rolling to place abdominal binder.  Cues for trunk elevation and min guard for safety/ pain.    Transfers Overall transfer level: Needs assistance Equipment used: Rolling walker (2 wheeled) Transfers: Sit to/from Stand Sit to Stand: Supervision         General transfer comment: Supervision for safety and set-up with all lines/leads.  Pt required cues for hand placement and re-education after holding to RW during stand to sit.    Ambulation/Gait Ambulation/Gait assistance: Min guard Ambulation Distance (Feet): 180 Feet Assistive device: Rolling walker (2 wheeled) Gait Pattern/deviations: Step-through pattern;Decreased stride length;Trunk flexed Gait velocity: mildly decreased   General Gait Details: Pt performed gait on RA with O2 sats 92-97% on RA.  HR  123-125 during mobility.  Pt required no rest break.  Safety for line/lead management and cues for pacing. Endurance improved from previous session.     Stairs            Wheelchair Mobility    Modified Rankin (Stroke Patients Only)       Balance Overall balance assessment: Needs assistance Sitting-balance support: No upper extremity supported;Feet supported Sitting balance-Leahy Scale: Good Sitting balance - Comments: Pt sat edge of bed for waiting for telemetry battery.  NO LOB observed.     Standing balance support: No upper extremity supported;During functional activity Standing balance-Leahy Scale: Fair Standing balance comment: Pt able to stand at sink for UB ADL and peri cleansing with no UE support, no LOB noted                     Cognition Arousal/Alertness: Awake/alert Behavior During Therapy: WFL for tasks assessed/performed Overall Cognitive Status: Within Functional Limits for tasks assessed                      Exercises      General Comments        Pertinent Vitals/Pain Pain Assessment: Faces Faces Pain Scale: Hurts a little bit Pain Location: abdomen with movement Pain Descriptors / Indicators: Grimacing Pain Intervention(s): Monitored during session (use of abdominal binder for support.  )    Home Living                      Prior Function  PT Goals (current goals can now be found in the care plan section) Acute Rehab PT Goals Patient Stated Goal: return home Potential to Achieve Goals: Good Progress towards PT goals: Progressing toward goals    Frequency  Min 3X/week    PT Plan Current plan remains appropriate    Co-evaluation             End of Session Equipment Utilized During Treatment: Gait belt (abdominal binder and portable pulse oximeter.  ) Activity Tolerance: Patient tolerated treatment well Patient left: in chair;with call bell/phone within reach;Other (comment)     Time:  [id number] PT Time Calculation (min) (ACUTE ONLY): 22 min  Charges:  $Gait Training: 8-22 mins                    G Codes:      Cristela Blue 02-Mar-2016, 4:10 PM  Governor Rooks, PTA pager [phone]

## 2016-02-29 NOTE — Progress Notes (Signed)
Nutrition Follow-up  DOCUMENTATION CODES:   Not applicable  INTERVENTION:   -TPN management per pharmacy -RD will follow for diet advancement  NUTRITION DIAGNOSIS:   Inadequate oral intake related to altered GI function as evidenced by NPO status.  Ongoing  GOAL:   Patient will meet greater than or equal to 90% of their needs  Progressing  MONITOR:   Diet advancement, Labs, Weight trends, Skin, I & O's  REASON FOR ASSESSMENT:   Consult New TPN/TNA  ASSESSMENT:   The patient is a 75 year old female who presents for an evaluation of a hernia. We are asked to see the patient in consultation by Dr. Vania Rea to evaluate her for a ventral hernia.  The patient is a 75 year old black female who has had a bulge along her upper abdomen for the last 4-5 years.  Over the last several months it has become more noticeable and sore.  She denies any nausea or vomiting.  Her appetite is good and her bowels are working normally.  S/p Procedure(s) 02/17/16: LAPAROSCOPIC VENTRAL HERNIA REPAIR WITH MESH (N/A) INSERTION OF MESH (N/A)  Pt remains with NGT, just clamped this AM per MD orders. Noted 310 ml output over the past 24 hours.   Reviewed pharmacy note. Plan to continueClinimixE 5/15at 6ml/hr Continue 20% lipid emulsion at 20ml/hr, which provides 1162 kcals and 48 grams protein, which meets 68% of estimated kcal needs and 60% of estimated protein needs. Plan to titrate TPN to goal when CBGS and electrolytes are more stable.   Case discussed with RN.  Labs reviewed: Mg: 1.6. K and Phos WDL. CBGS: 173-246.   Diet Order:  Diet NPO time specified TPN (CLINIMIX-E) Adult TPN (CLINIMIX-E) Adult  Skin:  Reviewed, no issues  Last BM:  02/28/16  Height:   Ht Readings from Last 1 Encounters:  02/21/16 5\' 5"  (1.651 m)    Weight:   Wt Readings from Last 1 Encounters:  02/21/16 160 lb (72.6 kg)    Ideal Body Weight:  56.8 kg  BMI:  Body mass index is 26.63  kg/m.  Estimated Nutritional Needs:   Kcal:  1700-1900  Protein:  80-95 grams  Fluid:  >1.6 L  EDUCATION NEEDS:   No education needs identified at this time  Lindsey Jenkins A. Jimmye Norman, RD, LDN, CDE Pager: [phone] After hours Pager: [phone]

## 2016-02-29 NOTE — Progress Notes (Addendum)
PARENTERAL NUTRITION CONSULT NOTE - FOLLOW UP  Pharmacy Consult for TPN Indication: Prolonged Ileus / SBP  Allergies  Allergen Reactions  . No Known Allergies     Patient Measurements: Height: 5\' 5"  (165.1 cm) Weight: 160 lb (72.6 kg) IBW/kg (Calculated) : 57 Adjusted Body Weight: 61.7 kg  Vital Signs: Temp: 98.6 F (37 C) (08/07 0437) Temp Source: Oral (08/06 2221) BP: 129/73 (08/07 0437) Pulse Rate: 109 (08/07 0437) Intake/Output from previous day: 08/06 0701 - 08/07 0700 In: 1883.3 [P.O.:50; I.V.:1058.3; NG/GT:120; IV Piggyback:655] Out: 2050 [Urine:1200; Emesis/NG output:850] Intake/Output from this shift: No intake/output data recorded.  Labs:  Recent Labs  02/27/16 0414 02/28/16 0510 02/29/16 0504  WBC 12.5* 19.2* 18.9*  HGB 11.9* 11.8* 11.4*  HCT 35.7* 35.9* 34.8*  PLT 272 257 227     Recent Labs  02/27/16 0414 02/28/16 0510 02/29/16 0503  NA 139 137 138  K 3.7 3.4* 3.7  CL 112* 108 104  CO2 21* 23 29  GLUCOSE 233* 210* 213*  BUN 12 6 <5*  CREATININE 0.79 0.67 0.71  CALCIUM 8.6* 8.7* 9.0  MG 1.6* 1.7 1.6*  PHOS 1.2* 2.1* 3.2  PROT 5.1*  --  5.0*  ALBUMIN 2.1*  --  1.8*  AST 21  --  28  ALT 26  --  26  ALKPHOS 55  --  63  BILITOT 0.6  --  0.5  PREALBUMIN 4.0*  --  4.2*  TRIG 219*  --  169*   Estimated Creatinine Clearance: 63.4 mL/min (by C-G formula based on SCr of 0.8 mg/dL).    Recent Labs  02/29/16 0011 02/29/16 0420 02/29/16 0829  GLUCAP 166* 202* 176*    Medications:  Scheduled:  . antiseptic oral rinse  7 mL Mouth Rinse BID  . heparin  5,000 Units Subcutaneous Q8H  . insulin aspart  0-15 Units Subcutaneous Q4H  . metoprolol  5 mg Intravenous Q4H  . piperacillin-tazobactam (ZOSYN)  IV  3.375 g Intravenous Q8H  . sodium chloride flush  10-40 mL Intracatheter Q12H  . vancomycin  750 mg Intravenous Q12H   Infusions:  . Marland KitchenTPN (CLINIMIX-E) Adult 40 mL/hr at 02/28/16 1706   And  . fat emulsion 240 mL (02/28/16 1706)    Insulin Requirements in the past 24 hours:  27 units of moderate SSI  every 4h (8 units since 1800 last PM when insulin was added to TPN)  Assessment: 75 year old female s/p laparoscopic ventral hernia repair with mesh insertion on 7/26. Patient had good appetite PTA but intake has been minimal since surgery. Now 9 days post-op with ileus and now mid-distal SBO without perforation on abdominal film without response to bowel rest since 8/2 and NG tube to LIWS to have PICC inserted and start TPN on 8/4.   Surgeries/Procedures: 7/26 Laparoscopic ventral hernia repair with mesh insertion  GI: Patient has Ileus/SBO on Abd film. Patient reported persistent emesis and bowel distension so NG tube placed. NG tube output up to 850 ml yesterday. Surgery may try clamping NG tube today. Abdominal pain improved. Has had several BMs over last couple of days- LBM 8/6. Albumin low and trending down at 1.8. Prealbumin low at 4.2.  Endo: Hx DM on oral therapy pta. CBGs remain uncontrolled on moderate SSI q4h + 20 units of insulin in TPN bag (176-213). TSH mildly low, but free T$ wnl, low free T3.   Lytes: K 3.7 (goal > 4 with ileus) and Mg down to 1.6 (goal >2  with ileus). Phos is improved at 3.2 after replacement yesterday. Will plan to replace K and Mg again today. CoCa 10.8. NS stopped 8/6.   Renal: SCr trending down and is now stable, CrCl ~13ml/min. UOP up to 0.7 cc/kg/hr. Net + 4.6L (out 2L yesterday).   Pulm: Increased O2 requirements to 4L [place]. Patient had episode of shallowed breathing 8/6 and rapid reponse called; after breathing treatment seems to be doing much better.  Cards: Lopressor IV q4h for ST (episode of SVT 8/3 PM) - occasional PVC's but no sustained VT. BP ok.  8/2 TTE-EF 65%, PAP 40.   Hepatobil: LFTs ok. Tbili wnl. Triglycerides elevated at 219  Neuro: AA&O. Pain score 0.  ID:  Day #4 of vancomycin and Zosyn for HCAP. Last Xray shows improvement in infiltrates. Afebrile, WBC  elevated at 18.9 but trending down (peak 19.2)  Best Practices: SQ heparin TPN Access: PICC 8/4 >> TPN start date: 8/4 >>  Current Nutrition:  NPO (Patient was on carb modified diet from 7/26 to 7/29, then clears until 8/2, now NPO) Clinimix E 5/15 at 67ml/hr and IV fat emulsions at 61ml/hr  Nutritional Goals: per RD recs on 8/4 Kcal: 1700-1900 Protein: 80-95 g  Plan:  Continue Clinimix E 5/15 at 19ml/hr Continue 20% lipid emulsion at 19ml/hr TPN and IV lipid emulsion provides 48 g of protein and 1162 kCals per day  Will plan to titrate TPN to goal once CBGs and electrolytes stable Add MVI and TE in TPN Increase to 25 units of regular insulin in TPN Continue moderate SSI every 4h and adjust as needed Monitor TPN labs, Mg and Phos tomorrow F/U improvement in ileus / SBO, advancement in diet  Give another Mg 2g IV x 1 today Give KCl 39mEq x 3 today   Sloan Leiter, PharmD, BCPS Clinical Pharmacist [phone] 02/29/2016,8:38 AM

## 2016-02-29 NOTE — Progress Notes (Signed)
NGT discontinued by MD as pt not feeling bloated since ngt clamping this morning. Clear liquid diet ordered

## 2016-02-29 NOTE — Plan of Care (Signed)
Problem: Nutrition: Goal: Ability to attain and maintain optimal nutritional status will improve Outcome: Progressing Clear liquid diet ordered this evening  Problem: Pain Management: Goal: Pain level will decrease Outcome: Progressing No prn pain meds administered since this morning

## 2016-02-29 NOTE — Progress Notes (Signed)
PULMONARY / CRITICAL CARE MEDICINE   Name: Lindsey Jenkins MRN: [id number] DOB: 1940-10-18    ADMISSION DATE:  02/17/2016 CONSULTATION DATE:  02/26/16  REFERRING MD:  Greer Pickerel MD  CHIEF COMPLAINT:  Dyspnea, respiratory failure.  HISTORY OF PRESENT ILLNESS:   75 year old with past medical history of diabetes mellitus, diverticulosis, hypertension, GERD. Admitted for laparoscopic repair of ventral hernia on 7/26. Postoperative course significant for persistent tachycardia with elevated d-dimer. Cardiology consulted and she got a CTA which did not show any PE but bilateral consolidation consistent with pneumonia. She also has persistent ileus an NG tube placed for decompression. PCCM consulted for worsening dyspnea, hypoxia.  SUBJECTIVE:  Sitting in chair, comfortable, no complaints.    VITAL SIGNS: BP 129/73   Pulse (!) 109   Temp 98.6 F (37 C)   Resp 17   Ht 5\' 5"  (1.651 m)   Wt 72.6 kg (160 lb)   SpO2 97%   BMI 26.63 kg/m   HEMODYNAMICS:    VENTILATOR SETTINGS:    INTAKE / OUTPUT: I/O last 3 completed shifts: In: 1883.3 [P.O.:50; I.V.:1058.3; NG/GT:120; IV Piggyback:655] Out: 2850 [Urine:1900; Emesis/NG output:950]  PHYSICAL EXAMINATION: General:  Elderly female, no distress, conversant Neuro: Moves all 4 extremities, no focal deficits. HEENT: [place]/AT, PERRL, no JVD Cardiovascular:  Tachycardia, regular rhythm, no murmurs rubs gallops. Lungs:  Clear to auscultation anteriorly, binder covers lower chest, no wheeze, +few crackles, shallow Abdomen:  Distended, diminished bowel sounds,  Musculoskeletal:  Normal tone and bulk Skin:  Intact  LABS:  BMET  Recent Labs Lab 02/27/16 0414 02/28/16 0510 02/29/16 0503  NA 139 137 138  K 3.7 3.4* 3.7  CL 112* 108 104  CO2 21* 23 29  BUN 12 6 <5*  CREATININE 0.79 0.67 0.71  GLUCOSE 233* 210* 213*    Electrolytes  Recent Labs Lab 02/27/16 0414 02/28/16 0510 02/29/16 0503  CALCIUM 8.6* 8.7* 9.0  MG 1.6* 1.7  1.6*  PHOS 1.2* 2.1* 3.2    CBC  Recent Labs Lab 02/27/16 0414 02/28/16 0510 02/29/16 0504  WBC 12.5* 19.2* 18.9*  HGB 11.9* 11.8* 11.4*  HCT 35.7* 35.9* 34.8*  PLT 272 257 227    Coag's No results for input(s): APTT, INR in the last 168 hours.  Sepsis Markers  Recent Labs Lab 02/26/16 1258 02/28/16 0510  PROCALCITON 0.48 0.29    ABG  Recent Labs Lab 02/28/16 0302  PHART 7.434  PCO2ART 33.9*  PO2ART 63.5*    Liver Enzymes  Recent Labs Lab 02/27/16 0414 02/29/16 0503  AST 21 28  ALT 26 26  ALKPHOS 55 63  BILITOT 0.6 0.5  ALBUMIN 2.1* 1.8*    Cardiac Enzymes No results for input(s): TROPONINI, PROBNP in the last 168 hours.  Glucose  Recent Labs Lab 02/28/16 1145 02/28/16 1643 02/28/16 2012 02/29/16 0011 02/29/16 0420 02/29/16 0829  GLUCAP 263* 197* 164* 166* 202* 176*    Imaging No results found. I reviewed CXR image 8/6 and agree w report  STUDIES:  CTA 8/2 > no PE, consolidation in the right lower lobe, small bilateral effusions.  CULTURES: Bcx 8/4 > Sputum Cx 8/4  ANTIBIOTICS: Vanc 8/4>> Zosyn 8/4>>  SIGNIFICANT EVENTS: 7/26 > Laparoscopic repair of ventral hernia  LINES/TUBES: PICC 8/4 >  DISCUSSION: 75 year old with worsening dyspnea, hypoxia. Status post laparoscopic repair of ventral hernia 7/26.   ASSESSMENT / PLAN: Resp failure with hypoxia > improving HCAP- CXR shows improving infiltrate RLL, now WBC trending down. Cultures NGTD x 4  days. PCT trending down S/p repair of ventral hernia- concern hypoventilation/ATX secondary to pain . Improved.  Ileus  - Narrow ABX to ceftraixone - Following sputum and blood cultures - follow PCT - Important to continue incentive spirometry - Mobilize  - Continue supplemental O2, keep O2 sats > 90%  Sick Euthyroid - Will need TSH, T4, T3 rechecked as outpatient.   Lindsey Jenkins, AGACNP-BC [facility] Pager [phone] or [phone]  02/29/2016  11:22 AM   STAFF NOTE: Linwood Dibbles, MD FACP have personally reviewed patient's available data, including medical history, events of note, physical examination and test results as part of my evaluation. I have discussed with resident/NP and other care providers such as pharmacist, RN and RRT. In addition, I personally evaluated patient and elicited key findings of: awake no distress, in a chair, NGT still with sig output, pcxr is improved rt base overall, I reviewed prior films and CT, she feels better, most impressed with ATX, but certainly we are obligated to treat hcap, she remains cultur neg, will narrow off Vosyn to ceftraixone, will follow clinical course and establish stop date, likley will treat 7 days, IS crucial and ambulation, pct reassuring also down trend, thyroid fxn most likely related to sick euthyroid, will need repeat thryoid fxn as outpt in 6 weeks   Lavon Paganini. Titus Mould, MD, [place] Pgr: [facility] 02/29/2016 2:43 PM

## 2016-02-29 NOTE — Progress Notes (Signed)
Pharmacy Antibiotic Note  Lindsey Jenkins is a 75 y.o. female admitted on 02/17/2016 with pneumonia.  Pharmacy has been consulted to change Vancomycin and Zosyn to Ceftriaxone dosing.  Plan: Change to Ceftriaxone 1g IV every 24 hours.  No dose adjustment needed.  Pharmacy will sign off antibiotic consult but continue to follow for TPN.   Height: 5\' 5"  (165.1 cm) Weight: 160 lb (72.6 kg) IBW/kg (Calculated) : 57  Temp (24hrs), Avg:98.4 F (36.9 C), Min:97.5 F (36.4 C), Max:99.1 F (37.3 C)   Recent Labs Lab 02/24/16 1045 02/25/16 0229 02/26/16 0413 02/26/16 0729 02/27/16 0414 02/28/16 0510 02/29/16 0503 02/29/16 0504  WBC 6.5  --   --  9.4 12.5* 19.2*  --  18.9*  CREATININE  --  1.54* 1.18*  --  0.79 0.67 0.71  --     Estimated Creatinine Clearance: 63.4 mL/min (by C-G formula based on SCr of 0.8 mg/dL).    Allergies  Allergen Reactions  . No Known Allergies     Antimicrobials this admission: Vanc 8/4 >>8/7 Zosyn 8/4 >>8/7 Ceftriaxone 8/7 >>  Microbiology results: Blood 8/4 >> ngtd  Thank you for allowing pharmacy to be a part of this patient's care.  Sloan Leiter, PharmD, BCPS Clinical Pharmacist [phone] 02/29/2016 11:47 AM

## 2016-02-29 NOTE — Care Management Important Message (Signed)
Important Message  Patient Details  Name: Lindsey Jenkins MRN: [id number] Date of Birth: 1941-02-08   Medicare Important Message Given:  Yes    Loann Quill 02/29/2016, 2:08 PM

## 2016-02-29 NOTE — Progress Notes (Signed)
NGT clamped as ordered by MD

## 2016-02-29 NOTE — Progress Notes (Signed)
Occupational Therapy Treatment Patient Details Name: Lindsey Jenkins MRN: [id number] DOB: [birth year] Today's Date: 02/29/2016    History of present illness Pt is a 75 y/o female s/p ventral hernis repair. Hospital course complicated by ileus. PMH including but not limited to DM and HTN.   OT comments  Patient making progress towards OT goals, continue plan of care for now. Pt continues to be limited by multiple lines/leads and decreased overall activity tolerance/endurance. Pt on RA during entire OT session (ADL at sink) and 02 sats remained greater than 90%.    Follow Up Recommendations  No OT follow up    Equipment Recommendations  3 in 1 bedside comode    Recommendations for Other Services  None at this time   Precautions / Restrictions Precautions Precautions: Fall;Other (comment) Precaution Comments: NG tube Restrictions Weight Bearing Restrictions: No    Mobility Bed Mobility General bed mobility comments: Pt found seated in recliner upon OT entering room  Transfers Overall transfer level: Needs assistance Equipment used: Rolling walker (2 wheeled) Transfers: Sit to/from Stand Sit to Stand: Supervision General transfer comment: Supervision for safety and set-up with all lines/leads     Balance Overall balance assessment: Needs assistance Sitting-balance support: No upper extremity supported;Feet supported Sitting balance-Leahy Scale: Good     Standing balance support: No upper extremity supported;During functional activity Standing balance-Leahy Scale: Fair Standing balance comment: Pt able to stand at sink for UB ADL and peri cleansing with no UE support, no LOB noted    ADL Overall ADL's : Needs assistance/impaired Eating/Feeding: Independent;Sitting   Grooming: Oral care;Set up;Standing;Supervision/safety   Upper Body Bathing: Set up;Sitting;Minimal assitance Upper Body Bathing Details (indicate cue type and reason): gown, due to lines/leads/NGtube  Lower Body  Bathing: Sit to/from stand;Min guard   Upper Body Dressing : Maximal assistance;Sitting (due to multiple lines )   Lower Body Dressing: Sit to/from stand;Min guard   Toilet Transfer: Supervision/safety;Ambulation;RW   Toileting- Clothing Manipulation and Hygiene: Supervision/safety;Sit to/from stand General ADL Comments: Pt able to cross BLEs over knee to reach feet for ADL with no increased pain. Pt on RA during ADL and sats remained greater than 90%.     Cognition   Behavior During Therapy: WFL for tasks assessed/performed Overall Cognitive Status: Within Functional Limits for tasks assessed                 Pertinent Vitals/ Pain       Pain Assessment: Faces Faces Pain Scale: No hurt  Frequency Min 2X/week     Progress Toward Goals  OT Goals(current goals can now be found in the care plan section)  Progress towards OT goals: Progressing toward goals  Acute Rehab OT Goals Patient Stated Goal: return home OT Goal Formulation: With patient Time For Goal Achievement: 03/08/16 (increased time due to complications during this hosptilization (PNA & ileus) ) Potential to Achieve Goals: Good  Plan Discharge plan remains appropriate    End of Session Equipment Utilized During Treatment: Rolling walker;Oxygen   Activity Tolerance Patient tolerated treatment well   Patient Left in chair;with call bell/phone within reach;with nursing/sitter in room   Nurse Communication Mobility status;Other (comment) (Pt on RA with sats greater than 90% entire session)     Time: [id number] OT Time Calculation (min): 24 min  Charges: OT General Charges $OT Visit: 1 Procedure OT Treatments $Self Care/Home Management : 23-37 mins  Chrys Racer , MS, OTR/L, CLT Pager: [phone]  02/29/2016, 12:15 PM

## 2016-02-29 NOTE — Progress Notes (Signed)
12 Days Post-Op   Subjective: Complains about the ng. Having bm's  Objective: Vital signs in last 24 hours: Temp:  [98.6 F (37 C)-100 F (37.8 C)] 100 F (37.8 C) (08/07 1500) Pulse Rate:  [106-109] 106 (08/07 1500) Resp:  [17-18] 18 (08/07 1500) BP: (129-143)/(72-76) 143/72 (08/07 1500) SpO2:  [94 %-100 %] 94 % (08/07 1500) Last BM Date: 02/28/16  Intake/Output from previous day: 08/06 0701 - 08/07 0700 In: 1883.3 [P.O.:50; I.V.:1058.3; NG/GT:120; IV Piggyback:655] Out: 2050 [Urine:1200; Emesis/NG output:850] Intake/Output this shift: No intake/output data recorded.  Resp: clear to auscultation bilaterally Cardio: regular rate and rhythm GI: soft, mild tenderness. incisions ok. good bs  Lab Results:   Recent Labs  02/28/16 0510 02/29/16 0504  WBC 19.2* 18.9*  HGB 11.8* 11.4*  HCT 35.9* 34.8*  PLT 257 227   BMET  Recent Labs  02/28/16 0510 02/29/16 0503  NA 137 138  K 3.4* 3.7  CL 108 104  CO2 23 29  GLUCOSE 210* 213*  BUN 6 <5*  CREATININE 0.67 0.71  CALCIUM 8.7* 9.0   PT/INR No results for input(s): LABPROT, INR in the last 72 hours. ABG  Recent Labs  02/28/16 0302  PHART 7.434  HCO3 22.4    Studies/Results: Dg Chest Port 1 View  Result Date: 02/28/2016 CLINICAL DATA:  75 year old female pneumonia EXAM: PORTABLE CHEST 1 VIEW COMPARISON:  Chest radiograph dated 08/04 7 FINDINGS: Single portable view of the chest demonstrate interval improvement of the previously seen right lung opacity. There is residual infiltrate in the right infrahilar region. Left lung base atelectatic changes noted. There is no pleural effusion or pneumothorax. The cardiac silhouette is within normal limits. Right sided PICC with tip over right atrium. Enteric tube with tip in the mid stomach. IMPRESSION: Interval improvement of the right infrahilar opacity with small residual infiltrate. Follow-up recommended. Electronically Signed   By: Anner Crete M.D.   On: 02/28/2016  03:21   Dg Abd 2 Views  Result Date: 02/28/2016 CLINICAL DATA:  Status post ventral abdominal wall hernia repair EXAM: ABDOMEN - 2 VIEW COMPARISON:  02/26/2016 FINDINGS: The nasogastric tube is looped in the stomach. The tip is in the projection of the distal stomach. Persistent gaseous distension of the small and large bowel loops are identified. The small bowel loops measure up to 5.6 cm. There has been mild increase in colonic gas. IMPRESSION: 1. Persistent small bowel obstruction pattern. There is been increase in colonic gas up to the level of the rectum which suggests mild interval improvement. Electronically Signed   By: Kerby Moors M.D.   On: 02/28/2016 11:22    Anti-infectives: Anti-infectives    Start     Dose/Rate Route Frequency Ordered Stop   02/29/16 1600  cefTRIAXone (ROCEPHIN) 1 g in dextrose 5 % 50 mL IVPB     1 g 100 mL/hr over 30 Minutes Intravenous Every 24 hours 02/29/16 1147     02/27/16 0100  vancomycin (VANCOCIN) IVPB 750 mg/150 ml premix  Status:  Discontinued     750 mg 150 mL/hr over 60 Minutes Intravenous Every 12 hours 02/26/16 1233 02/29/16 1100   02/26/16 1300  vancomycin (VANCOCIN) IVPB 1000 mg/200 mL premix     1,000 mg 200 mL/hr over 60 Minutes Intravenous  Once 02/26/16 1233 02/26/16 1522   02/26/16 1300  piperacillin-tazobactam (ZOSYN) IVPB 3.375 g  Status:  Discontinued     3.375 g 12.5 mL/hr over 240 Minutes Intravenous Every 8 hours 02/26/16 1233 02/29/16  1100   02/17/16 0800  ceFAZolin (ANCEF) IVPB 2g/100 mL premix     2 g 200 mL/hr over 30 Minutes Intravenous To ShortStay Surgical 02/16/16 1059 02/17/16 0829      Assessment/Plan: s/p Procedure(s): LAPAROSCOPIC VENTRAL HERNIA REPAIR WITH MESH (N/A) INSERTION OF MESH (N/A) tpn for nutrition support  Clamp ng and if she tolerates will d/c later today Ambulate Vanc/zosyn for possible pneumonia  LOS: 7 days    TOTH III,PAUL S 02/29/2016

## 2016-02-29 NOTE — Progress Notes (Signed)
Pt profile Patient Profile: 75 yo female with PMH of HTN, HLD, and DMII, but no past cardiac history presented with ventral hernia repair, cardiology consulted post op for persistent tachycardia. D-dimer elevated at 19.32. TSH also suppressed at 0.277.  She also has persistent ileus an NG tube placed for decompression.  Subjective: No chest pain, no SOB, +increased fatigue at times.  NG is clamped.   Objective: Vital signs in last 24 hours: Temp:  [97.5 F (36.4 C)-99.1 F (37.3 C)] 98.6 F (37 C) (08/07 0437) Pulse Rate:  [105-109] 109 (08/07 0437) Resp:  [17-20] 17 (08/07 0437) BP: (121-136)/(70-76) 129/73 (08/07 0437) SpO2:  [97 %-100 %] 97 % (08/07 0437) Weight change:  Last BM Date: 02/28/16 Intake/Output from previous day: 08/06 0701 - 08/07 0700 In: 1883.3 [P.O.:50; I.V.:1058.3; NG/GT:120; IV Piggyback:655] Out: 2050 [Urine:1200; Emesis/NG output:850] Intake/Output this shift: No intake/output data recorded.  PE: General:Pleasant affect, NAD Skin:Warm and dry, brisk capillary refill HEENT:normocephalic, sclera clear, mucus membranes moist Neck:supple, no JVD  Heart:S1S2 RRR without murmur, gallup, rub or click Lungs:clear , ant without rales, rhonchi, or wheezes [id number], non tender, hypoactive  BS, do not palpate liver spleen or masses Ext:1+ lower ext edema,  2+ radial pulses Neuro:alert and oriented X 3, MAE, follows commands, + facial symmetry  tele:  SR some ST rarely and + PVCs  Lab Results:  Recent Labs  02/28/16 0510 02/29/16 0504  WBC 19.2* 18.9*  HGB 11.8* 11.4*  HCT 35.9* 34.8*  PLT 257 227   BMET  Recent Labs  02/28/16 0510 02/29/16 0503  NA 137 138  K 3.4* 3.7  CL 108 104  CO2 23 29  GLUCOSE 210* 213*  BUN 6 <5*  CREATININE 0.67 0.71  CALCIUM 8.7* 9.0   No results for input(s): TROPONINI in the last 72 hours.  Invalid input(s): CK, MB  Lab Results  Component Value Date   CHOL 157 06/22/2015   HDL 50.80 06/22/2015   LDLCALC 90 06/22/2015   LDLDIRECT 84.4 05/16/2011   TRIG 169 (H) 02/29/2016   CHOLHDL 3 06/22/2015   Lab Results  Component Value Date   HGBA1C 8.9 12/22/2015     Lab Results  Component Value Date   TSH 0.277 (L) 02/24/2016     Hepatic Function Panel  Recent Labs  02/29/16 0503  PROT 5.0*  ALBUMIN 1.8*  AST 28  ALT 26  ALKPHOS 63  BILITOT 0.5   No results for input(s): CHOL in the last 72 hours. No results for input(s): PROTIME in the last 72 hours.     Studies/Results: Dg Chest Port 1 View  Result Date: 02/28/2016 CLINICAL DATA:  75 year old female pneumonia EXAM: PORTABLE CHEST 1 VIEW COMPARISON:  Chest radiograph dated 08/04 7 FINDINGS: Single portable view of the chest demonstrate interval improvement of the previously seen right lung opacity. There is residual infiltrate in the right infrahilar region. Left lung base atelectatic changes noted. There is no pleural effusion or pneumothorax. The cardiac silhouette is within normal limits. Right sided PICC with tip over right atrium. Enteric tube with tip in the mid stomach. IMPRESSION: Interval improvement of the right infrahilar opacity with small residual infiltrate. Follow-up recommended. Electronically Signed   By: Anner Crete M.D.   On: 02/28/2016 03:21   Dg Abd 2 Views  Result Date: 02/28/2016 CLINICAL DATA:  Status post ventral abdominal wall hernia repair EXAM: ABDOMEN - 2 VIEW COMPARISON:  02/26/2016 FINDINGS: The nasogastric tube is looped in the stomach.  The tip is in the projection of the distal stomach. Persistent gaseous distension of the small and large bowel loops are identified. The small bowel loops measure up to 5.6 cm. There has been mild increase in colonic gas. IMPRESSION: 1. Persistent small bowel obstruction pattern. There is been increase in colonic gas up to the level of the rectum which suggests mild interval improvement. Electronically Signed   By: Kerby Moors M.D.   On: 02/28/2016 11:22     Medications: I have reviewed the patient's current medications. Scheduled Meds: . antiseptic oral rinse  7 mL Mouth Rinse BID  . heparin  5,000 Units Subcutaneous Q8H  . insulin aspart  0-15 Units Subcutaneous Q4H  . metoprolol  5 mg Intravenous Q4H  . potassium chloride  10 mEq Intravenous Q1 Hr x 3  . sodium chloride flush  10-40 mL Intracatheter Q12H   Continuous Infusions: . Marland KitchenTPN (CLINIMIX-E) Adult 40 mL/hr at 02/28/16 1706   And  . fat emulsion 240 mL (02/28/16 1706)  . Marland KitchenTPN (CLINIMIX-E) Adult     And  . fat emulsion     PRN Meds:.levalbuterol, menthol-cetylpyridinium, methocarbamol, morphine injection, ondansetron **OR** ondansetron (ZOFRAN) IV, sodium chloride flush  Assessment/Plan: Active Problems:   Ventral hernia without obstruction or gangrene   Tachycardia with 121 - 140 beats per minute   HCAP (healthcare-associated pneumonia)  1. Persistent tachycardia: In the setting of recent surgery/ pain post surgery, Elevated D-dimer of 19 and suppressed TSH. No CP nor palpitations.  2D echo shows vigorous LV systolic function with EF of 65-70%. Normal RA size and RV function. No PE on CT but bilateral airspace consolidation ? c/w pneumonia, on abx. Metoprolol changed to iv dosing 5mg  Q4 with bowel obstruction and oral absorption difficulty; Will continue to hold nifedipine.  --rate mostly controlled on occ up to 119 but not frequently.  HR improved 99 on tele now improved with increased frequency metoprolol 5mg  IV Q4.    Occasional PVC's couplets noted. No sustained VT.  2. TSH mildly low: TSH 0.277, but free T4 nl and low free T3; doubt hyperthyroidism . 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 Consider endo eval.  3. Ventral hernia s/p repair: Partial small bowel obstruction on CT with NG  4. [id number]. Holding lisinopril   5. HTN: stable in 121 - 136 range  6. HLD: on zocor -though held for NPO  7. DM II: on Amaryl and metformin at home, on  SSI here per IM  8. Lung consolidation: Afebrile. Pulmonary toilet. Pulmonary following.    LOS: 7 days   Time spent with pt. :59minutes. Cecilie Kicks  Nurse Practitioner Certified Pager [phone] or after 5pm and on weekends call [phone] 02/29/2016, 11:24 AM  The patient was seen, examined and discussed with Cecilie Kicks, NP and I agree with the above.   The patient is still NPO on TPN, tachycardia most probably sec to pain and hypovolemia, I would increase iv fluids and continue the current dose of metoprolol iv 5 mg Q4H. We will follow.  Ena Dawley 02/29/2016

## 2016-03-01 DIAGNOSIS — F502 Bulimia nervosa, unspecified: Secondary | ICD-10-CM

## 2016-03-01 DIAGNOSIS — J189 Pneumonia, unspecified organism: Secondary | ICD-10-CM

## 2016-03-01 LAB — BASIC METABOLIC PANEL
ANION GAP: 7 (ref 5–15)
BUN: 5 mg/dL — AB (ref 6–20)
CHLORIDE: 98 mmol/L — AB (ref 101–111)
CO2: 28 mmol/L (ref 22–32)
Calcium: 8.9 mg/dL (ref 8.9–10.3)
Creatinine, Ser: 0.75 mg/dL (ref 0.44–1.00)
Glucose, Bld: 440 mg/dL — ABNORMAL HIGH (ref 65–99)
POTASSIUM: 4.8 mmol/L (ref 3.5–5.1)
SODIUM: 133 mmol/L — AB (ref 135–145)

## 2016-03-01 LAB — GLUCOSE, CAPILLARY
GLUCOSE-CAPILLARY: 154 mg/dL — AB (ref 65–99)
GLUCOSE-CAPILLARY: 185 mg/dL — AB (ref 65–99)
GLUCOSE-CAPILLARY: 204 mg/dL — AB (ref 65–99)
GLUCOSE-CAPILLARY: 211 mg/dL — AB (ref 65–99)
Glucose-Capillary: 182 mg/dL — ABNORMAL HIGH (ref 65–99)
Glucose-Capillary: 270 mg/dL — ABNORMAL HIGH (ref 65–99)

## 2016-03-01 LAB — PHOSPHORUS: PHOSPHORUS: 4.5 mg/dL (ref 2.5–4.6)

## 2016-03-01 LAB — PROCALCITONIN: PROCALCITONIN: 0.23 ng/mL

## 2016-03-01 LAB — MAGNESIUM: MAGNESIUM: 2.2 mg/dL (ref 1.7–2.4)

## 2016-03-01 MED ORDER — INSULIN ASPART 100 UNIT/ML ~~LOC~~ SOLN
0.0000 [IU] | Freq: Every day | SUBCUTANEOUS | Status: DC
  Administered 2016-03-01: 2 [IU] via SUBCUTANEOUS
  Administered 2016-03-02: 0 [IU] via SUBCUTANEOUS

## 2016-03-01 MED ORDER — ENSURE ENLIVE PO LIQD
237.0000 mL | Freq: Two times a day (BID) | ORAL | Status: DC
  Administered 2016-03-02 (×2): 237 mL via ORAL

## 2016-03-01 MED ORDER — DEXTROSE 5 % IV SOLN
1.0000 g | INTRAVENOUS | Status: AC
  Administered 2016-03-01: 1 g via INTRAVENOUS
  Filled 2016-03-01: qty 10

## 2016-03-01 MED ORDER — METOPROLOL TARTRATE 5 MG/5ML IV SOLN
7.5000 mg | INTRAVENOUS | Status: DC
  Administered 2016-03-01 – 2016-03-03 (×11): 7.5 mg via INTRAVENOUS
  Filled 2016-03-01 (×12): qty 10

## 2016-03-01 MED ORDER — INSULIN GLARGINE 100 UNIT/ML ~~LOC~~ SOLN
8.0000 [IU] | Freq: Every day | SUBCUTANEOUS | Status: DC
  Administered 2016-03-01 – 2016-03-02 (×2): 8 [IU] via SUBCUTANEOUS
  Filled 2016-03-01 (×3): qty 0.08

## 2016-03-01 MED ORDER — INSULIN ASPART 100 UNIT/ML ~~LOC~~ SOLN
0.0000 [IU] | Freq: Three times a day (TID) | SUBCUTANEOUS | Status: DC
  Administered 2016-03-01: 5 [IU] via SUBCUTANEOUS
  Administered 2016-03-02 (×3): 3 [IU] via SUBCUTANEOUS
  Administered 2016-03-03: 2 [IU] via SUBCUTANEOUS

## 2016-03-01 NOTE — Progress Notes (Signed)
13 Days Post-Op  Subjective: No complaints. Feels better with ng out. Still passing flatus and having bm  Objective: Vital signs in last 24 hours: Temp:  [98 F (36.7 C)-100 F (37.8 C)] 99.1 F (37.3 C) (08/08 0612) Pulse Rate:  [103-112] 106 (08/08 0612) Resp:  [16-18] 16 (08/08 0612) BP: (126-143)/(66-88) 133/77 (08/08 0612) SpO2:  [92 %-98 %] 95 % (08/08 0612) Last BM Date: 02/28/16  Intake/Output from previous day: 08/07 0701 - 08/08 0700 In: 400 [I.V.:400] Out: 400 [Urine:400] Intake/Output this shift: No intake/output data recorded.  Resp: clear to auscultation bilaterally and using accessory muscles but she says she is comfortable Cardio: regular rate and rhythm GI: soft, mild tenderness. good bs. incisions look good  Lab Results:   Recent Labs  02/28/16 0510 02/29/16 0504  WBC 19.2* 18.9*  HGB 11.8* 11.4*  HCT 35.9* 34.8*  PLT 257 227   BMET  Recent Labs  02/29/16 0503 03/01/16 0500  NA 138 133*  K 3.7 4.8  CL 104 98*  CO2 29 28  GLUCOSE 213* 440*  BUN <5* 5*  CREATININE 0.71 0.75  CALCIUM 9.0 8.9   PT/INR No results for input(s): LABPROT, INR in the last 72 hours. ABG  Recent Labs  02/28/16 0302  PHART 7.434  HCO3 22.4    Studies/Results: Dg Abd 2 Views  Result Date: 02/28/2016 CLINICAL DATA:  Status post ventral abdominal wall hernia repair EXAM: ABDOMEN - 2 VIEW COMPARISON:  02/26/2016 FINDINGS: The nasogastric tube is looped in the stomach. The tip is in the projection of the distal stomach. Persistent gaseous distension of the small and large bowel loops are identified. The small bowel loops measure up to 5.6 cm. There has been mild increase in colonic gas. IMPRESSION: 1. Persistent small bowel obstruction pattern. There is been increase in colonic gas up to the level of the rectum which suggests mild interval improvement. Electronically Signed   By: Kerby Moors M.D.   On: 02/28/2016 11:22    Anti-infectives: Anti-infectives    Start     Dose/Rate Route Frequency Ordered Stop   02/29/16 1600  cefTRIAXone (ROCEPHIN) 1 g in dextrose 5 % 50 mL IVPB     1 g 100 mL/hr over 30 Minutes Intravenous Every 24 hours 02/29/16 1147     02/27/16 0100  vancomycin (VANCOCIN) IVPB 750 mg/150 ml premix  Status:  Discontinued     750 mg 150 mL/hr over 60 Minutes Intravenous Every 12 hours 02/26/16 1233 02/29/16 1100   02/26/16 1300  vancomycin (VANCOCIN) IVPB 1000 mg/200 mL premix     1,000 mg 200 mL/hr over 60 Minutes Intravenous  Once 02/26/16 1233 02/26/16 1522   02/26/16 1300  piperacillin-tazobactam (ZOSYN) IVPB 3.375 g  Status:  Discontinued     3.375 g 12.5 mL/hr over 240 Minutes Intravenous Every 8 hours 02/26/16 1233 02/29/16 1100   02/17/16 0800  ceFAZolin (ANCEF) IVPB 2g/100 mL premix     2 g 200 mL/hr over 30 Minutes Intravenous To ShortStay Surgical 02/16/16 1059 02/17/16 0829      Assessment/Plan: s/p Procedure(s): LAPAROSCOPIC VENTRAL HERNIA REPAIR WITH MESH (N/A) INSERTION OF MESH (N/A) Advance diet. Start fulls today Work with PT for deconditioning Tpn for nutrition support. Will stop if she tolerates fulls vanc zosyn stopped. On rocephin now day 4 of abx  LOS: 8 days    TOTH III,Genean Adamski S 03/01/2016

## 2016-03-01 NOTE — Progress Notes (Signed)
Nutrition Follow-up  DOCUMENTATION CODES:   Not applicable  INTERVENTION:   -Ensure Enlive po BID, each supplement provides 350 kcal and 20 grams of protein  NUTRITION DIAGNOSIS:   Inadequate oral intake related to altered GI function as evidenced by meal completion < 50%.  Ongoing  GOAL:   Patient will meet greater than or equal to 90% of their needs  Progressing  MONITOR:   PO intake, Supplement acceptance, Diet advancement, Labs, Weight trends, Skin, I & O's  REASON FOR ASSESSMENT:   Consult New TPN/TNA  ASSESSMENT:   The patient is a 75 year old female who presents for an evaluation of a hernia. We are asked to see the patient in consultation by Dr. Vania Rea to evaluate her for a ventral hernia.  The patient is a 75 year old black female who has had a bulge along her upper abdomen for the last 4-5 years.  Over the last several months it has become more noticeable and sore.  She denies any nausea or vomiting.  Her appetite is good and her bowels are working normally.  S/p Procedure(s) 02/17/16: LAPAROSCOPIC VENTRAL HERNIA REPAIR WITH MESH (N/A) INSERTION OF MESH (N/A)  NGT clamped and removed on 02/29/16. Pt advancing to full liquid diet. Meal completion 50%.   Per pharmacy note, TPN continues to infuse via PICC (ClinimixE 5/15at 63ml/hr and 20% lipid emulsion at 63ml/hr, which provides 1162 kcals and 48 grams protein, which meets 68% of estimated kcal needs and 60% of estimated protein needs). Per MD notes, plan to d/c TPN this evening. RD will add Ensure supplements in attempt to optimize nutritional intake.   Labs reviewed: Na: 133, CBGS: 154-185.   Diet Order:  TPN (CLINIMIX-E) Adult Diet full liquid Room service appropriate? Yes; Fluid consistency: Thin  Skin:  Reviewed, no issues  Last BM:  02/28/16  Height:   Ht Readings from Last 1 Encounters:  02/21/16 5\' 5"  (1.651 m)    Weight:   Wt Readings from Last 1 Encounters:  02/21/16 160 lb (72.6 kg)     Ideal Body Weight:  56.8 kg  BMI:  Body mass index is 26.63 kg/m.  Estimated Nutritional Needs:   Kcal:  1700-1900  Protein:  80-95 grams  Fluid:  >1.6 L  EDUCATION NEEDS:   No education needs identified at this time  Lindsey Jenkins Lindsey Jenkins, RD, LDN, CDE Pager: [phone] After hours Pager: [phone]

## 2016-03-01 NOTE — Progress Notes (Signed)
PULMONARY / CRITICAL CARE MEDICINE   Name: Lindsey Jenkins MRN: [id number] DOB: 1941/04/02    ADMISSION DATE:  02/17/2016 CONSULTATION DATE:  02/26/16  REFERRING MD:  Greer Pickerel MD  CHIEF COMPLAINT:  Dyspnea, respiratory failure.  HISTORY OF PRESENT ILLNESS:   75 year old with past medical history of diabetes mellitus, diverticulosis, hypertension, GERD. Admitted for laparoscopic repair of ventral hernia on 7/26. Postoperative course significant for persistent tachycardia with elevated d-dimer. Cardiology consulted and she got a CTA which did not show any PE but bilateral consolidation consistent with pneumonia. She also has persistent ileus an NG tube placed for decompression. PCCM consulted for worsening dyspnea, hypoxia.  SUBJECTIVE:  Sitting in chair, mildly dyspneic, she has just been up and moving around. No complaints otherwise.   VITAL SIGNS: BP 132/74 (BP Location: Left Arm)   Pulse (!) 103   Temp 100.2 F (37.9 C) (Oral) Comment: RN notified  Resp 18   Ht 5\' 5"  (1.651 m)   Wt 72.6 kg (160 lb)   SpO2 93%   BMI 26.63 kg/m   HEMODYNAMICS:    VENTILATOR SETTINGS:    INTAKE / OUTPUT: I/O last 3 completed shifts: In: 400 [I.V.:400] Out: 950 [Urine:700; Emesis/NG output:250]  PHYSICAL EXAMINATION:  General:  Elderly female, no distress, conversant Neuro: Moves all 4 extremities, no focal deficits. HEENT: [place]/AT, PERRL, no JVD Cardiovascular:  Tachycardia, regular rhythm, no murmurs rubs gallops. Lungs:  Clear to auscultation posteriorly,  +few crackles bases Abdomen:  Distended, diminished bowel sounds,  Musculoskeletal:  Normal tone and bulk Skin:  Intact  LABS:  BMET  Recent Labs Lab 02/28/16 0510 02/29/16 0503 03/01/16 0500  NA 137 138 133*  K 3.4* 3.7 4.8  CL 108 104 98*  CO2 23 29 28   BUN 6 <5* 5*  CREATININE 0.67 0.71 0.75  GLUCOSE 210* 213* 440*    Electrolytes  Recent Labs Lab 02/28/16 0510 02/29/16 0503 03/01/16 0500  CALCIUM 8.7* 9.0  8.9  MG 1.7 1.6* 2.2  PHOS 2.1* 3.2 4.5    CBC  Recent Labs Lab 02/27/16 0414 02/28/16 0510 02/29/16 0504  WBC 12.5* 19.2* 18.9*  HGB 11.9* 11.8* 11.4*  HCT 35.7* 35.9* 34.8*  PLT 272 257 227    Coag's No results for input(s): APTT, INR in the last 168 hours.  Sepsis Markers  Recent Labs Lab 02/26/16 1258 02/28/16 0510 03/01/16 0500  PROCALCITON 0.48 0.29 0.23    ABG  Recent Labs Lab 02/28/16 0302  PHART 7.434  PCO2ART 33.9*  PO2ART 63.5*    Liver Enzymes  Recent Labs Lab 02/27/16 0414 02/29/16 0503  AST 21 28  ALT 26 26  ALKPHOS 55 63  BILITOT 0.6 0.5  ALBUMIN 2.1* 1.8*    Cardiac Enzymes No results for input(s): TROPONINI, PROBNP in the last 168 hours.  Glucose  Recent Labs Lab 02/29/16 1228 02/29/16 1608 02/29/16 2029 03/01/16 0026 03/01/16 0409 03/01/16 0803  GLUCAP 246* 173* 173* 185* 154* 182*    Imaging No results found. I reviewed CXR image 8/6 and agree w report  STUDIES:  CTA 8/2 > no PE, consolidation in the right lower lobe, small bilateral effusions.  CULTURES: BCx 8/4 > Sputum Cx 8/4  ANTIBIOTICS: Vanc 8/4>>8/7 Zosyn 8/4>>8/7 CTX 8/7 > (8/8 proposed stop date)  SIGNIFICANT EVENTS: 7/26 > Laparoscopic repair of ventral hernia  LINES/TUBES: PICC 8/4 >  DISCUSSION: 75 year old with worsening dyspnea, hypoxia. Status post laparoscopic repair of ventral hernia 7/26.   ASSESSMENT / PLAN:  Resp  failure with hypoxia > improving HCAP- CXR shows improving infiltrate RLL, now WBC trending down. Cultures NGTD x 4 days. PCT trending down S/p repair of ventral hernia- concern hypoventilation/ATX secondary to pain . Improved. Ileus  - ABX to ceftriaxone 8/7 > would complete 5 day course (last dose today) - Following sputum and blood cultures - follow PCT (trending down) - Important to continue incentive spirometry - Mobilize, ambulate - Continue supplemental O2 PRN, keep O2 sats > 90% - Ambulatory desaturation  assessment prior to discharge to determine continued O2 needs.  Hyperglycemia/DM (glucose 440 today on BMP) - recommend DM coordinator evaluation  Sick Euthyroid - Will need TSH, T4, T3 rechecked as outpatient 6 weeks.  Georgann Housekeeper, AGACNP-BC [facility] Pager [phone] or [phone]  03/01/2016 11:03 AM   STAFF NOTE: Linwood Dibbles, MD FACP have personally reviewed patient's available data, including medical history, events of note, physical examination and test results as part of my evaluation. I have discussed with resident/NP and other care providers such as pharmacist, RN and RRT. In addition, I personally evaluated patient and elicited key findings of: no distress, improved ronchi overall at base, no fevers, rt base radiographic ally had gotten better, she is mobilizing, PCT hasd come down to less 0.26, would add stop date at today day 5 with above, will sign off call if needed, may need more aggressive glu control  Lavon Paganini. Titus Mould, MD, [place] Pgr: [facility] 03/01/2016 12:21 PM

## 2016-03-01 NOTE — Progress Notes (Signed)
Assessed the PICC line, TPN and fluids are both infusing with out alarms. All connections checked and tighten. Advised patient to notify the nurse if the TPN began alarming again and that the nurse could then notify the Vascular Specialty Team. Lindsey Jenkins

## 2016-03-01 NOTE — Progress Notes (Signed)
PARENTERAL NUTRITION CONSULT NOTE - FOLLOW UP  Pharmacy Consult for TPN Indication: Prolonged Ileus / SBP  Allergies  Allergen Reactions  . No Known Allergies     Patient Measurements: Height: 5\' 5"  (165.1 cm) Weight: 160 lb (72.6 kg) IBW/kg (Calculated) : 57 Adjusted Body Weight: 61.7 kg  Vital Signs: Temp: 99.1 F (37.3 C) (08/08 0612) Temp Source: Oral (08/08 0612) BP: 133/77 (08/08 0612) Pulse Rate: 106 (08/08 0612) Intake/Output from previous day: 08/07 0701 - 08/08 0700 In: 400 [I.V.:400] Out: 400 [Urine:400] Intake/Output from this shift: Total I/O In: 270 [P.O.:270] Out: -   Labs:  Recent Labs  02/28/16 0510 02/29/16 0504  WBC 19.2* 18.9*  HGB 11.8* 11.4*  HCT 35.9* 34.8*  PLT 257 227     Recent Labs  02/28/16 0510 02/29/16 0503 03/01/16 0500  NA 137 138 133*  K 3.4* 3.7 4.8  CL 108 104 98*  CO2 23 29 28   GLUCOSE 210* 213* 440*  BUN 6 <5* 5*  CREATININE 0.67 0.71 0.75  CALCIUM 8.7* 9.0 8.9  MG 1.7 1.6* 2.2  PHOS 2.1* 3.2 4.5  PROT  --  5.0*  --   ALBUMIN  --  1.8*  --   AST  --  28  --   ALT  --  26  --   ALKPHOS  --  63  --   BILITOT  --  0.5  --   PREALBUMIN  --  4.2*  --   TRIG  --  169*  --    Estimated Creatinine Clearance: 63.4 mL/min (by C-G formula based on SCr of 0.8 mg/dL).    Recent Labs  03/01/16 0026 03/01/16 0409 03/01/16 0803  GLUCAP 185* 154* 182*    Medications:  Scheduled:  . antiseptic oral rinse  7 mL Mouth Rinse BID  . cefTRIAXone (ROCEPHIN)  IV  1 g Intravenous Q24H  . heparin  5,000 Units Subcutaneous Q8H  . insulin aspart  0-15 Units Subcutaneous Q4H  . metoprolol  5 mg Intravenous Q4H  . sodium chloride flush  10-40 mL Intracatheter Q12H   Infusions:  . Marland KitchenTPN (CLINIMIX-E) Adult 40 mL/hr at 03/01/16 0600   And  . fat emulsion 240 mL (02/29/16 1756)   Insulin Requirements in the past 24 hours:  23 units of moderate SSI   Assessment: 75 year old female s/p laparoscopic ventral hernia repair  with mesh insertion on 7/26. Patient had good appetite PTA but intake has been minimal since surgery. Now 9 days post-op with ileus and now mid-distal SBO without perforation on abdominal film without response to bowel rest since 8/2 and NG tube to LIWS to have PICC inserted and start TPN on 8/4.   Surgeries/Procedures: 7/26 Laparoscopic ventral hernia repair with mesh insertion  GI: Patient has Ileus/SBO on Abd film. Patient reported persistent emesis and bowel distension so NG tube placed. NG tube removed- passing flatus and having BM. Advancing to full liquids today and plan to stop TPN if tolerates- 50% intake with breakfast this AM. Albumin low and trending down at 1.8. Prealbumin low at 4.2.  Endo: Hx DM on oral therapy pta. CBGs better controlled on moderate SSI q4h + 25 units of insulin in TPN bag (154-185). TSH mildly low, but free T4 wnl, low free T3.   Lytes: K 4.8 (goal > 4 with ileus) and Mg 2.2 (goal >2 with ileus) after replacement. Phos 4.5. CoCa 10.7 (CaxPhos product 48). Na trending down at 133.   Renal:  SCr trending down and is now stable, CrCl ~10ml/min. UOP down 0.2 cc/kg/hr (accuracy?). Net + 4.6L (out 2L yesterday).   Pulm: Increased O2 requirements to 4L [place]. Patient had episode of shallowed breathing 8/6 and rapid reponse called; after breathing treatment seems to be doing much better.  Cards: Lopressor IV q4h for ST (episode of SVT 8/3 PM) - occasional PVC's but no sustained VT. BP ok.  8/2 TTE-EF 65%, PAP 40.   Hepatobil: LFTs ok. Tbili wnl. Triglycerides down at 169.  Neuro: AA&O. Pain score 0.  ID:  Day #5 of antibiotics for HCAP- changed to ceftriaxone on 8/7. Last Xray shows improvement in infiltrates. Tmax 100.2, WBC elevated at 18.9 on 8/7 but trending down (peak 19.2). PCT 0.23.  Best Practices: SQ heparin TPN Access: PICC 8/4 >> TPN start date: 8/4 >>  Current Nutrition:  NPO (Patient was on carb modified diet from 7/26 to 7/29, then clears until  8/2, now NPO) Clinimix E 5/15 at 73ml/hr and IV fat emulsions at 51ml/hr  Nutritional Goals: per RD recs on 8/7 Kcal: 1700-1900 Protein: 80-95 g  Plan:  Patient tolerating full liquids - Per Dr. Marlou Starks, will stop TPN after today's bag. Reduce Clinimix to 37ml/hr (1/2 rate) at 1200PM and continue at this adjusted rate until 1800 then stop TPN- Discuss with RN Joaquim Lai) Stop 20% lipid emulsion at 1800 PM.  Adjust moderate SSI to ACHS.  Consider restart of oral agents for glucose control as able.  Sloan Leiter, PharmD, BCPS Clinical Pharmacist [phone] 03/01/2016,9:48 AM

## 2016-03-01 NOTE — Progress Notes (Signed)
Occupational Therapy Treatment Patient Details Name: Lindsey Jenkins MRN: [id number] DOB: 1941-06-09 Today's Date: 03/01/2016    History of present illness Pt is a 75 y/o female s/p ventral hernis repair. Hospital course complicated by ileus and PNA. PMH including but not limited to DM and HTN.   OT comments  Pt with primary complaint of being fatigued. Educated in energy conservation strategies and provided handout to reinforce. Pt moving well for toileting and grooming at sink. Abdominal pain improved. Pt off 02 and on clear liquids.  Follow Up Recommendations  No OT follow up    Equipment Recommendations  3 in 1 bedside comode    Recommendations for Other Services      Precautions / Restrictions Precautions Precautions: Fall Precaution Comments: watch HR and 02 sats       Mobility Bed Mobility Overal bed mobility: Needs Assistance   Rolling: Supervision Sidelying to sit: Supervision     Sit to sidelying: Supervision General bed mobility comments: HOB up, use of rail and heavy reliance on UEs  Transfers Overall transfer level: Needs assistance Equipment used: Rolling walker (2 wheeled)   Sit to Stand: Supervision              Balance     Sitting balance-Leahy Scale: Good                             ADL Overall ADL's : Needs assistance/impaired     Grooming: Wash/dry hands;Standing;Supervision/safety               Lower Body Dressing: Set up;Sitting/lateral leans Lower Body Dressing Details (indicate cue type and reason): donned socks at EOB  Toilet Transfer: Supervision/safety;Ambulation;RW   Toileting- Clothing Manipulation and Hygiene: Supervision/safety;Sit to/from stand       Functional mobility during ADLs: Supervision/safety;Rolling walker General ADL Comments: Pt educated in energy conservation and handout provided.      Vision                     Perception     Praxis      Cognition   Behavior During  Therapy: WFL for tasks assessed/performed Overall Cognitive Status: Within Functional Limits for tasks assessed                       Extremity/Trunk Assessment               Exercises     Shoulder Instructions       General Comments      Pertinent Vitals/ Pain       Pain Assessment: No/denies pain  Home Living                                          Prior Functioning/Environment              Frequency Min 2X/week     Progress Toward Goals  OT Goals(current goals can now be found in the care plan section)  Progress towards OT goals: Progressing toward goals  Acute Rehab OT Goals Patient Stated Goal: return home Time For Goal Achievement: 03/08/16 Potential to Achieve Goals: Good ADL Goals Additional ADL Goal #2: Pt will state at least 3 energy conservation strategies.  Plan Discharge plan remains appropriate    Co-evaluation  End of Session Equipment Utilized During Treatment: Rolling walker   Activity Tolerance Patient limited by fatigue   Patient Left in bed;with call bell/phone within reach;with family/visitor present   Nurse Communication          Time: [id number] OT Time Calculation (min): 16 min  Charges: OT General Charges $OT Visit: 1 Procedure OT Treatments $Self Care/Home Management : 8-22 mins  Malka So 03/01/2016, 3:48 PM  [phone]

## 2016-03-01 NOTE — Progress Notes (Signed)
Patient Profile: 75 yo female with PMH of HTN, HLD, and DMII, but no past cardiac history presented with ventral hernia repair, cardiology consulted post op for persistent tachycardia. D-dimer elevated at 19.32 but CT negative for PE. TSH also suppressed at 0.277.  She also had persistent ileus and required NG tube for decompression.  Subjective: Doing better. NGT out. Bowels are moving. Tolerating clear liquid diet. Still in mild pain from recent surgery.   Objective: Vital signs in last 24 hours: Temp:  [98 F (36.7 C)-100.2 F (37.9 C)] 100.2 F (37.9 C) (08/08 0950) Pulse Rate:  [103-112] 103 (08/08 0950) Resp:  [16-18] 18 (08/08 0950) BP: (126-143)/(66-88) 132/74 (08/08 0950) SpO2:  [92 %-98 %] 93 % (08/08 0950) Last BM Date: 02/28/16  Intake/Output from previous day: 08/07 0701 - 08/08 0700 In: 400 [I.V.:400] Out: 400 [Urine:400] Intake/Output this shift: Total I/O In: 270 [P.O.:270] Out: -   Medications . antiseptic oral rinse  7 mL Mouth Rinse BID  . cefTRIAXone (ROCEPHIN)  IV  1 g Intravenous Q24H  . feeding supplement (ENSURE ENLIVE)  237 mL Oral BID BM  . heparin  5,000 Units Subcutaneous Q8H  . insulin aspart  0-15 Units Subcutaneous Q4H  . [START ON 03/02/2016] insulin aspart  0-15 Units Subcutaneous TID WC  . insulin aspart  0-5 Units Subcutaneous QHS  . insulin glargine  8 Units Subcutaneous QHS  . metoprolol  5 mg Intravenous Q4H  . sodium chloride flush  10-40 mL Intracatheter Q12H   PE: General appearance: alert, cooperative and no distress Neck: no carotid bruit and no JVD Lungs: clear to auscultation bilaterally Heart: regularly rhythm and tachy rate Extremities: no LEE Pulses: 2+ and symmetric Skin: warm and dry Neurologic: normal   Lab Results:   Recent Labs  02/28/16 0510 02/29/16 0504  WBC 19.2* 18.9*  HGB 11.8* 11.4*  HCT 35.9* 34.8*  PLT 257 227   BMET  Recent Labs  02/28/16 0510 02/29/16 0503 03/01/16 0500  NA 137 138  133*  K 3.4* 3.7 4.8  CL 108 104 98*  CO2 23 29 28   GLUCOSE 210* 213* 440*  BUN 6 <5* 5*  CREATININE 0.67 0.71 0.75  CALCIUM 8.7* 9.0 8.9     Assessment/Plan  Active Problems:   Ventral hernia without obstruction or gangrene   Tachycardia   HCAP (healthcare-associated pneumonia)   Abdominal distension   Right middle lobe pneumonia   1. Persistent tachycardia: In the setting of recent surgery/ pain post surgery. Elevated D-dimer of 19 but CT negative for PE. Suppressed TSH but normal Free T4 and low T3, doubt hyperthryroidism.2D echo shows vigorous LV systolic function with EF of 65-70%. Normal RA size and RV function. No PE on CT but bilateral airspace consolidation ? c/w pneumonia, on abx. Still sinus tach on telemetry with HR in the 110s. -- Continue IV metoprolol, 5mg  Q4 with bowel obstruction and oral absorption difficulty. -- continue pain management and IVFs.     2. TSH mildly low: TSH 0.277, but free T4 nl and low free T3; doubt hyperthyroidism . 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 Consider endo eval.  3. Ventral hernia s/p repair: Partial small bowel obstruction resolved. NGT removed, patient producing BMs.  4. [id number]. Holding lisinopril   5. HTN: stable in 121 - 136 range  6. HLD: on zocor -though held for NPO  7. DM II: on Amaryl and metformin at home, on SSI here per IM  8. Lung consolidation: Afebrile.  Pulmonary toilet. Pulmonary following.    LOS: 8 days   Brittainy M. Rosita Fire, PA-C 03/01/2016 10:30 AM  The patient was seen, examined and discussed with Brittainy M. Rosita Fire, PA-C and I agree with the above.   The patient is still NPO on TPN, tachycardia most probably sec to pain and hypovolemia, I would increase iv fluids and increase the current dose of metoprolol to iv 7.5 mg Q4H. We will follow.  Ena Dawley 03/01/2016

## 2016-03-02 LAB — GLUCOSE, CAPILLARY
GLUCOSE-CAPILLARY: 161 mg/dL — AB (ref 65–99)
Glucose-Capillary: 155 mg/dL — ABNORMAL HIGH (ref 65–99)
Glucose-Capillary: 179 mg/dL — ABNORMAL HIGH (ref 65–99)
Glucose-Capillary: 168 mg/dL — ABNORMAL HIGH (ref 65–99)

## 2016-03-02 LAB — CULTURE, BLOOD (ROUTINE X 2)
CULTURE: NO GROWTH
CULTURE: NO GROWTH

## 2016-03-02 NOTE — Progress Notes (Signed)
Physical Therapy Treatment Patient Details Name: Lindsey Jenkins MRN: [id number] DOB: 1941-06-17 Today's Date: 03/02/2016    History of Present Illness Pt is a 75 y/o female s/p ventral hernis repair. Hospital course complicated by ileus and PNA. PMH including but not limited to DM and HTN.    PT Comments    Pt progressing well with mobility, she ambulated 200' with RW, no loss of balance. Distance limited by fatigue and mild dyspnea.   Follow Up Recommendations  Supervision for mobility/OOB     Equipment Recommendations  None recommended by PT    Recommendations for Other Services       Precautions / Restrictions Precautions Precautions: Fall Precaution Comments: watch HR and 02 sats Restrictions Weight Bearing Restrictions: No    Mobility  Bed Mobility Overal bed mobility: Needs Assistance   Rolling: Modified independent (Device/Increase time) Sidelying to sit: Modified independent (Device/Increase time)       General bed mobility comments: HOB up, use of rail and heavy reliance on UEs  Transfers Overall transfer level: Needs assistance Equipment used: Rolling walker (2 wheeled) Transfers: Sit to/from Stand Sit to Stand: Supervision         General transfer comment: VCs for hand placement  Ambulation/Gait Ambulation/Gait assistance: Supervision Ambulation Distance (Feet): 200 Feet Assistive device: Rolling walker (2 wheeled) Gait Pattern/deviations: Step-through pattern;Decreased stride length   Gait velocity interpretation: at or above normal speed for age/gender General Gait Details: steady with RW, no LOB, 2/4 dyspnea    Stairs            Wheelchair Mobility    Modified Rankin (Stroke Patients Only)       Balance     Sitting balance-Leahy Scale: Good       Standing balance-Leahy Scale: Fair                      Cognition Arousal/Alertness: Awake/alert Behavior During Therapy: WFL for tasks assessed/performed Overall  Cognitive Status: Within Functional Limits for tasks assessed                      Exercises      General Comments        Pertinent Vitals/Pain Pain Score: 4  Pain Location: abdomen with walking Pain Descriptors / Indicators: Sore Pain Intervention(s): Monitored during session;Premedicated before session;Limited activity within patient's tolerance    Home Living                      Prior Function            PT Goals (current goals can now be found in the care plan section) Acute Rehab PT Goals Patient Stated Goal: return home PT Goal Formulation: With patient Time For Goal Achievement: 03/16/16 Potential to Achieve Goals: Good Progress towards PT goals: Goals met and updated - see care plan    Frequency  Min 3X/week    PT Plan Current plan remains appropriate    Co-evaluation             End of Session Equipment Utilized During Treatment: Gait belt Activity Tolerance: Patient tolerated treatment well Patient left: with call bell/phone within reach;in bed     Time: 1325-1340 PT Time Calculation (min) (ACUTE ONLY): 15 min  Charges:  $Gait Training: 8-22 mins                    G Codes:      [place],  Darl Brisbin Kistler 03/02/2016, 1:47 PM [phone]

## 2016-03-02 NOTE — Care Management Important Message (Signed)
Important Message  Patient Details  Name: Lindsey Jenkins MRN: [id number] Date of Birth: Dec 03, 1940   Medicare Important Message Given:  Yes    Loann Quill 03/02/2016, 3:14 PM

## 2016-03-02 NOTE — Progress Notes (Signed)
14 Days Post-Op  Subjective: No complaints. Feeling better. Had a bm  Objective: Vital signs in last 24 hours: Temp:  [98.6 F (37 C)-99.3 F (37.4 C)] 99 F (37.2 C) (08/09 1500) Pulse Rate:  [101-119] 101 (08/09 1500) Resp:  [17-23] 23 (08/09 1500) BP: (126-144)/(74-84) 139/81 (08/09 1500) SpO2:  [97 %-100 %] 100 % (08/09 1500) Last BM Date: 03/01/16  Intake/Output from previous day: 08/08 0701 - 08/09 0700 In: 977 [P.O.:690; I.V.:287] Out: 400 [Urine:400] Intake/Output this shift: No intake/output data recorded.  Resp: clear to auscultation bilaterally and slight increased work of breathing Cardio: regular rate and rhythm GI: soft, mild tenderness. good bs. incisions look good  Lab Results:   Recent Labs  02/29/16 0504  WBC 18.9*  HGB 11.4*  HCT 34.8*  PLT 227   BMET  Recent Labs  02/29/16 0503 03/01/16 0500  NA 138 133*  K 3.7 4.8  CL 104 98*  CO2 29 28  GLUCOSE 213* 440*  BUN <5* 5*  CREATININE 0.71 0.75  CALCIUM 9.0 8.9   PT/INR No results for input(s): LABPROT, INR in the last 72 hours. ABG No results for input(s): PHART, HCO3 in the last 72 hours.  Invalid input(s): PCO2, PO2  Studies/Results: No results found.  Anti-infectives: Anti-infectives    Start     Dose/Rate Route Frequency Ordered Stop   03/01/16 1600  cefTRIAXone (ROCEPHIN) 1 g in dextrose 5 % 50 mL IVPB     1 g 100 mL/hr over 30 Minutes Intravenous Every 24 hours 03/01/16 1129 03/01/16 1724   02/29/16 1600  cefTRIAXone (ROCEPHIN) 1 g in dextrose 5 % 50 mL IVPB  Status:  Discontinued     1 g 100 mL/hr over 30 Minutes Intravenous Every 24 hours 02/29/16 1147 03/01/16 1129   02/27/16 0100  vancomycin (VANCOCIN) IVPB 750 mg/150 ml premix  Status:  Discontinued     750 mg 150 mL/hr over 60 Minutes Intravenous Every 12 hours 02/26/16 1233 02/29/16 1100   02/26/16 1300  vancomycin (VANCOCIN) IVPB 1000 mg/200 mL premix     1,000 mg 200 mL/hr over 60 Minutes Intravenous  Once  02/26/16 1233 02/26/16 1522   02/26/16 1300  piperacillin-tazobactam (ZOSYN) IVPB 3.375 g  Status:  Discontinued     3.375 g 12.5 mL/hr over 240 Minutes Intravenous Every 8 hours 02/26/16 1233 02/29/16 1100   02/17/16 0800  ceFAZolin (ANCEF) IVPB 2g/100 mL premix     2 g 200 mL/hr over 30 Minutes Intravenous To ShortStay Surgical 02/16/16 1059 02/17/16 0829      Assessment/Plan: s/p Procedure(s): LAPAROSCOPIC VENTRAL HERNIA REPAIR WITH MESH (N/A) INSERTION OF MESH (N/A) Advance diet  Check wbc tomorrow Hopefully home soon  LOS: 9 days    TOTH III,Ashiyah Pavlak S 03/02/2016

## 2016-03-03 LAB — CBC WITH DIFFERENTIAL/PLATELET
BASOS ABS: 0 10*3/uL (ref 0.0–0.1)
Basophils Relative: 0 %
EOS PCT: 2 %
Eosinophils Absolute: 0.3 10*3/uL (ref 0.0–0.7)
HEMATOCRIT: 32.6 % — AB (ref 36.0–46.0)
HEMOGLOBIN: 10.9 g/dL — AB (ref 12.0–15.0)
LYMPHS ABS: 2 10*3/uL (ref 0.7–4.0)
Lymphocytes Relative: 13 %
MCH: 25.6 pg — AB (ref 26.0–34.0)
MCHC: 33.4 g/dL (ref 30.0–36.0)
MCV: 76.7 fL — ABNORMAL LOW (ref 78.0–100.0)
MONO ABS: 1.8 10*3/uL — AB (ref 0.1–1.0)
Monocytes Relative: 12 %
Neutro Abs: 11 10*3/uL — ABNORMAL HIGH (ref 1.7–7.7)
Neutrophils Relative %: 73 %
Platelets: 252 10*3/uL (ref 150–400)
RBC: 4.25 MIL/uL (ref 3.87–5.11)
RDW: 14.7 % (ref 11.5–15.5)
WBC: 15.1 10*3/uL — AB (ref 4.0–10.5)

## 2016-03-03 LAB — COMPREHENSIVE METABOLIC PANEL
ALT: 182 U/L — ABNORMAL HIGH (ref 14–54)
ANION GAP: 9 (ref 5–15)
AST: 98 U/L — ABNORMAL HIGH (ref 15–41)
Albumin: 1.9 g/dL — ABNORMAL LOW (ref 3.5–5.0)
Alkaline Phosphatase: 109 U/L (ref 38–126)
BUN: <5 mg/dL — ABNORMAL LOW (ref 6–20)
CHLORIDE: 103 mmol/L (ref 101–111)
CO2: 26 mmol/L (ref 22–32)
Calcium: 8.8 mg/dL — ABNORMAL LOW (ref 8.9–10.3)
Creatinine, Ser: 0.79 mg/dL (ref 0.44–1.00)
Glucose, Bld: 123 mg/dL — ABNORMAL HIGH (ref 65–99)
POTASSIUM: 4 mmol/L (ref 3.5–5.1)
Sodium: 138 mmol/L (ref 135–145)
Total Bilirubin: 0.7 mg/dL (ref 0.3–1.2)
Total Protein: 5.5 g/dL — ABNORMAL LOW (ref 6.5–8.1)

## 2016-03-03 LAB — TRIGLYCERIDES: Triglycerides: 160 mg/dL — ABNORMAL HIGH (ref <150)

## 2016-03-03 LAB — PREALBUMIN: PREALBUMIN: 6.1 mg/dL — AB (ref 18–38)

## 2016-03-03 LAB — GLUCOSE, CAPILLARY: GLUCOSE-CAPILLARY: 131 mg/dL — AB (ref 65–99)

## 2016-03-03 LAB — MAGNESIUM: MAGNESIUM: 1.5 mg/dL — AB (ref 1.7–2.4)

## 2016-03-03 LAB — PHOSPHORUS: PHOSPHORUS: 3.9 mg/dL (ref 2.5–4.6)

## 2016-03-03 MED ORDER — AMOXICILLIN-POT CLAVULANATE 875-125 MG PO TABS: 1.0000 | ORAL_TABLET | Freq: Two times a day (BID) | ORAL | 0 refills | Status: AC

## 2016-03-03 MED ORDER — METOPROLOL TARTRATE 25 MG PO TABS: 25.0000 mg | ORAL_TABLET | Freq: Two times a day (BID) | ORAL | Status: DC

## 2016-03-03 NOTE — Progress Notes (Signed)
Occupational Therapy Treatment Patient Details Name: Lindsey Jenkins MRN: [id number] DOB: 1941-03-16 Today's Date: 03/03/2016    History of present illness Pt is a 75 y/o female s/p ventral hernis repair. Hospital course complicated by ileus and PNA. PMH including but not limited to DM and HTN.   OT comments  Focus of session on instruction in energy conservation strategies during ADL. Pt verbalizing understanding of all information and reported reading handout. Pt eager to go home after removal of PICC line.  Follow Up Recommendations  No OT follow up    Equipment Recommendations  3 in 1 bedside comode    Recommendations for Other Services      Precautions / Restrictions Precautions Precautions: Fall       Mobility Bed Mobility Overal bed mobility: Modified Independent                Transfers Overall transfer level: Needs assistance Equipment used:  (pushed IV)   Sit to Stand: Modified independent (Device/Increase time)              Balance     Sitting balance-Leahy Scale: Good       Standing balance-Leahy Scale: Fair                     ADL Overall ADL's : Needs assistance/impaired     Grooming: Oral care;Wash/dry hands;Sitting;Brushing hair Grooming Details (indicate cue type and reason): educated pt on seated rest breaks or sitting for activities that involve prolonged standing for energy conservation         Upper Body Dressing : Sitting;Minimal assistance   Lower Body Dressing: Modified independent;Sit to/from stand Lower Body Dressing Details (indicate cue type and reason): reminded pt to bring foot to hands and avoid bending for energy conservation Toilet Transfer: Supervision/safety;Ambulation (pushed IV pole)   Toileting- Clothing Manipulation and Hygiene: Modified independent;Sit to/from Nurse, children's Details (indicate cue type and reason): pt to use shower seat, instructed in benefits of good ventilation and  avoiding hot water for energy conservation and avoidance of SOB Functional mobility during ADLs: Supervision/safety (pushed IV pole) General ADL Comments: Instructed pt in transporting items safely with RW and use of RW for energy conservation. Instructed to sit when necessary when preparing meals and folding laundry. Pt will have assist of her husband for IADL who is in good health.      Vision                     Perception     Praxis      Cognition   Behavior During Therapy: WFL for tasks assessed/performed Overall Cognitive Status: Within Functional Limits for tasks assessed                       Extremity/Trunk Assessment               Exercises Other Exercises Other Exercises: instructed to continue incentive spirometer at home   Shoulder Instructions       General Comments      Pertinent Vitals/ Pain       Pain Assessment: No/denies pain  Home Living                                          Prior Functioning/Environment  Frequency Min 2X/week     Progress Toward Goals  OT Goals(current goals can now be found in the care plan section)  Progress towards OT goals: Progressing toward goals  Acute Rehab OT Goals Patient Stated Goal: return home Time For Goal Achievement: 03/08/16 Potential to Achieve Goals: Good  Plan Discharge plan remains appropriate    Co-evaluation                 End of Session Equipment Utilized During Treatment: Gait belt   Activity Tolerance Patient tolerated treatment well   Patient Left in bed;with call bell/phone within reach   Nurse Communication          Time: 0948-1000 OT Time Calculation (min): 12 min  Charges: OT General Charges $OT Visit: 1 Procedure OT Treatments $Self Care/Home Management : 8-22 mins  Malka So 03/03/2016, 10:12 AM [phone]

## 2016-03-03 NOTE — Progress Notes (Signed)
    Patient Profile: 75 yo female with PMH of HTN, HLD, and DMII, but no past cardiac history presented with ventral hernia repair, cardiology consulted post op for persistent tachycardia. D-dimer elevated at 19.32 but CT negative for PE. TSH also suppressed at 0.277.  She also had persistent ileus and required NG tube for decompression.  Subjective: Doing much better, started to eat this am, going home today.  Objective: Vital signs in last 24 hours: Temp:  [98.1 F (36.7 C)-99.5 F (37.5 C)] 98.1 F (36.7 C) (08/10 0946) Pulse Rate:  [98-108] 98 (08/10 0946) Resp:  [18-23] 18 (08/10 0532) BP: (114-139)/(58-81) 127/58 (08/10 0946) SpO2:  [93 %-100 %] 97 % (08/10 0946) Last BM Date: 03/01/16  Intake/Output from previous day: 08/09 0701 - 08/10 0700 In: 120 [P.O.:120] Out: -  Intake/Output this shift: No intake/output data recorded.  Medications . antiseptic oral rinse  7 mL Mouth Rinse BID  . feeding supplement (ENSURE ENLIVE)  237 mL Oral BID BM  . heparin  5,000 Units Subcutaneous Q8H  . insulin aspart  0-15 Units Subcutaneous TID WC  . insulin aspart  0-5 Units Subcutaneous QHS  . insulin glargine  8 Units Subcutaneous QHS  . metoprolol  7.5 mg Intravenous Q4H  . sodium chloride flush  10-40 mL Intracatheter Q12H   PE: General appearance: alert, cooperative and no distress Neck: no carotid bruit and no JVD Lungs: clear to auscultation bilaterally Heart: regularly rhythm and tachy rate Extremities: no LEE Pulses: 2+ and symmetric Skin: warm and dry Neurologic: normal   Lab Results:   Recent Labs  03/03/16 0446  WBC 15.1*  HGB 10.9*  HCT 32.6*  PLT 252   BMET  Recent Labs  03/01/16 0500 03/03/16 0446  NA 133* 138  K 4.8 4.0  CL 98* 103  CO2 28 26  GLUCOSE 440* 123*  BUN 5* <5*  CREATININE 0.75 0.79  CALCIUM 8.9 8.8*     Assessment/Plan  Active Problems:   Ventral hernia without obstruction or gangrene   Tachycardia   HCAP  (healthcare-associated pneumonia)   Abdominal distension   Right middle lobe pneumonia   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 Persistent tachycardia: In the setting of recent surgery/ pain post surgery and HAP. Still sinus tach on telemetry with HR in the 100-110. -- switch iv metoprolol to metoprolol 25 mg po BID - advise to hydrate well  2. TSH mildly low: TSH 0.277, but free T4 nl and low free T3; doubt hyperthyroidism . 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 Consider endo eval.  3. Ventral hernia s/p repair: Partial small bowel obstruction resolved. NGT removed, patient producing BMs.  4. [id number]. Holding lisinopril   5. HTN: stable in 121 - 136 range  6. HLD: on zocor -though held for NPO  7. DM II: on Amaryl and metformin at home, on SSI here per IM  8. Lung consolidation: Afebrile. Pulmonary toilet. Pulmonary following.    Ena Dawley 03/03/2016

## 2016-03-03 NOTE — Progress Notes (Signed)
Discussed discharge summary with patient. Reviewed all medications with patient. Patient received Rx. Patient ready for discharge. Patient transported out by wheel chair.

## 2016-03-03 NOTE — Progress Notes (Signed)
15 Days Post-Op  Subjective: No complaints. Feeling good  Objective: Vital signs in last 24 hours: Temp:  [99 F (37.2 C)-99.5 F (37.5 C)] 99 F (37.2 C) (08/10 0532) Pulse Rate:  [101-108] 108 (08/10 0532) Resp:  [18-23] 18 (08/10 0532) BP: (114-144)/(64-84) 120/64 (08/10 0532) SpO2:  [93 %-100 %] 93 % (08/10 0532) Last BM Date: 03/01/16  Intake/Output from previous day: 08/09 0701 - 08/10 0700 In: 120 [P.O.:120] Out: -  Intake/Output this shift: No intake/output data recorded.  Resp: clear to auscultation bilaterally Cardio: regular rate and rhythm GI: soft, nontender  Lab Results:   Recent Labs  03/03/16 0446  WBC 15.1*  HGB 10.9*  HCT 32.6*  PLT 252   BMET  Recent Labs  03/01/16 0500 03/03/16 0446  NA 133* 138  K 4.8 4.0  CL 98* 103  CO2 28 26  GLUCOSE 440* 123*  BUN 5* <5*  CREATININE 0.75 0.79  CALCIUM 8.9 8.8*   PT/INR No results for input(s): LABPROT, INR in the last 72 hours. ABG No results for input(s): PHART, HCO3 in the last 72 hours.  Invalid input(s): PCO2, PO2  Studies/Results: No results found.  Anti-infectives: Anti-infectives    Start     Dose/Rate Route Frequency Ordered Stop   03/01/16 1600  cefTRIAXone (ROCEPHIN) 1 g in dextrose 5 % 50 mL IVPB     1 g 100 mL/hr over 30 Minutes Intravenous Every 24 hours 03/01/16 1129 03/01/16 1724   02/29/16 1600  cefTRIAXone (ROCEPHIN) 1 g in dextrose 5 % 50 mL IVPB  Status:  Discontinued     1 g 100 mL/hr over 30 Minutes Intravenous Every 24 hours 02/29/16 1147 03/01/16 1129   02/27/16 0100  vancomycin (VANCOCIN) IVPB 750 mg/150 ml premix  Status:  Discontinued     750 mg 150 mL/hr over 60 Minutes Intravenous Every 12 hours 02/26/16 1233 02/29/16 1100   02/26/16 1300  vancomycin (VANCOCIN) IVPB 1000 mg/200 mL premix     1,000 mg 200 mL/hr over 60 Minutes Intravenous  Once 02/26/16 1233 02/26/16 1522   02/26/16 1300  piperacillin-tazobactam (ZOSYN) IVPB 3.375 g  Status:  Discontinued      3.375 g 12.5 mL/hr over 240 Minutes Intravenous Every 8 hours 02/26/16 1233 02/29/16 1100   02/17/16 0800  ceFAZolin (ANCEF) IVPB 2g/100 mL premix     2 g 200 mL/hr over 30 Minutes Intravenous To ShortStay Surgical 02/16/16 1059 02/17/16 0829      Assessment/Plan: s/p Procedure(s): LAPAROSCOPIC VENTRAL HERNIA REPAIR WITH MESH (N/A) INSERTION OF MESH (N/A) Advance diet Discharge  Wbc decreasing. Will send home on a few more days of abx for pna  LOS: 10 days    TOTH III,PAUL S 03/03/2016

## 2016-03-09 NOTE — Discharge Summary (Signed)
Physician Discharge Summary  Patient ID: Lindsey Jenkins MRN: [id number] DOB/AGE: 24-Dec-1940 75 y.o.  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 There were no significant adhesions noted. Postoperatively she developed an ileus which was thought to be secondary to a possible pneumonia. She was started on tpn and antibiotics. After roughly a week in the hospital she began to gradually improve. Eventually she was able to eat and we stopped the tpn. She was discharged home on several more days of oral abx and seemed to be doing well  Consults: pulmonary/intensive care and medicine  Significant Diagnostic Studies: CT abd/pel  Treatments: antibiotics: vancomycin and Zosyn, TPN and surgery: as above  Discharge Exam: Blood pressure (!) 127/58, pulse 98, temperature 98.1 F (36.7 C), temperature source Oral, resp. rate 18, height 5\' 5"  (1.651 m), weight 72.6 kg (160 lb), SpO2 97 %. Resp: clear to auscultation bilaterally Cardio: regular rate and rhythm GI: soft, nontender. incisions look good  Disposition: 01-Home or Self Care  Discharge Instructions    Call MD for:  difficulty breathing, headache or visual disturbances    Complete by:  As directed   Call MD for:  extreme fatigue    Complete by:  As directed   Call MD for:  hives    Complete by:  As directed   Call MD for:  persistant dizziness or light-headedness    Complete by:  As directed   Call MD for:  persistant nausea and vomiting    Complete by:  As directed   Call MD for:  redness, tenderness, or signs  of infection (pain, swelling, redness, odor or green/yellow discharge around incision site)    Complete by:  As directed   Call MD for:  severe uncontrolled pain    Complete by:  As directed   Call MD for:  temperature >100.4    Complete by:  As directed   Diet - low sodium heart healthy    Complete by:  As directed   Discharge instructions    Complete by:  As directed   May shower. Diet as tolerated. No heavy lifting   Increase activity slowly    Complete by:  As directed   No wound care    Complete by:  As directed       Medication List    TAKE these medications   accu-chek multiclix lancets Two times a day dx E11.9   amoxicillin-clavulanate 875-125 MG tablet Commonly known as:  AUGMENTIN Take 1 tablet by mouth 2 (two) times daily.   aspirin 325 MG tablet Take 325 mg by mouth daily.   colestipol 1 g tablet Commonly known as:  COLESTID Take 3 tablets (3 g total) by mouth daily. What changed:  when to take this   glimepiride 1 MG tablet Commonly known as:  AMARYL Take 0.5 tablets (0.5 mg total) by mouth daily with breakfast.   glucose blood test strip Commonly known as:  ACCU-CHEK COMPACT TEST DRUM Two times a day dx E11.9   ibuprofen 200 MG tablet Commonly known as:  ADVIL,MOTRIN Take 400 mg by mouth every 6 (six) hours  as needed.   JANUVIA 100 MG tablet Generic drug:  sitaGLIPtin take 1 tablet by mouth once daily What changed:  See the new instructions.   lisinopril 40 MG tablet Commonly known as:  PRINIVIL,ZESTRIL take 1 tablet by mouth once daily What changed:  See the new instructions.   metFORMIN 500 MG tablet Commonly known as:  GLUCOPHAGE take 2 tablets by mouth twice a day with meals   NIFEdipine 30 MG 24 hr tablet Commonly known as:  NIFEDICAL XL Take 1 tablet (30 mg total) by mouth daily. What changed:  when to take this   oxyCODONE-acetaminophen 5-325 MG tablet Commonly known as:  ROXICET Take 1-2 tablets by mouth every 4 (four) hours as  needed.   simvastatin 40 MG tablet Commonly known as:  ZOCOR take 2 tablets by mouth at bedtime   traMADol 50 MG tablet Commonly known as:  ULTRAM take 1 tablet by mouth every 4 hours if needed for pain      Follow-up Information    TOTH III,PAUL S, MD Follow up in 2 week(s).   Specialty:  General Surgery Contact information: [place] [address] [phone]           Signed: Merrie Roof 03/09/2016, 1:01 PM

## 2016-03-14 ENCOUNTER — Emergency Department (HOSPITAL_COMMUNITY)
Admission: EM | Admit: 2016-03-14 | Discharge: 2016-03-25 | Disposition: E | Payer: Medicare Other | Attending: Emergency Medicine | Admitting: Emergency Medicine

## 2016-03-14 ENCOUNTER — Emergency Department (HOSPITAL_COMMUNITY): Payer: Medicare Other

## 2016-03-14 DIAGNOSIS — Z7984 Long term (current) use of oral hypoglycemic drugs: Secondary | ICD-10-CM | POA: Diagnosis not present

## 2016-03-14 DIAGNOSIS — E119 Type 2 diabetes mellitus without complications: Secondary | ICD-10-CM | POA: Insufficient documentation

## 2016-03-14 DIAGNOSIS — Z794 Long term (current) use of insulin: Secondary | ICD-10-CM | POA: Insufficient documentation

## 2016-03-14 DIAGNOSIS — Z79899 Other long term (current) drug therapy: Secondary | ICD-10-CM | POA: Insufficient documentation

## 2016-03-14 DIAGNOSIS — I469 Cardiac arrest, cause unspecified: Secondary | ICD-10-CM

## 2016-03-14 DIAGNOSIS — Z7982 Long term (current) use of aspirin: Secondary | ICD-10-CM | POA: Insufficient documentation

## 2016-03-14 DIAGNOSIS — I10 Essential (primary) hypertension: Secondary | ICD-10-CM | POA: Diagnosis not present

## 2016-03-14 LAB — I-STAT CG4 LACTIC ACID, ED: LACTIC ACID, VENOUS: 15.63 mmol/L — AB (ref 0.5–1.9)

## 2016-03-14 LAB — CBC WITH DIFFERENTIAL/PLATELET
BASOS ABS: 0 10*3/uL (ref 0.0–0.1)
BASOS PCT: 0 %
EOS ABS: 0 10*3/uL (ref 0.0–0.7)
Eosinophils Relative: 0 %
HCT: 28.8 % — ABNORMAL LOW (ref 36.0–46.0)
Hemoglobin: 8.9 g/dL — ABNORMAL LOW (ref 12.0–15.0)
LYMPHS PCT: 26 %
Lymphs Abs: 4.7 10*3/uL — ABNORMAL HIGH (ref 0.7–4.0)
MCH: 25.1 pg — ABNORMAL LOW (ref 26.0–34.0)
MCHC: 30.9 g/dL (ref 30.0–36.0)
MCV: 81.4 fL (ref 78.0–100.0)
MONOS PCT: 6 %
Monocytes Absolute: 1.1 10*3/uL — ABNORMAL HIGH (ref 0.1–1.0)
NEUTROS ABS: 12.2 10*3/uL — AB (ref 1.7–7.7)
NEUTROS PCT: 68 %
PLATELETS: 350 10*3/uL (ref 150–400)
RBC: 3.54 MIL/uL — ABNORMAL LOW (ref 3.87–5.11)
RDW: 15.3 % (ref 11.5–15.5)
WBC: 18 10*3/uL — ABNORMAL HIGH (ref 4.0–10.5)

## 2016-03-14 LAB — COMPREHENSIVE METABOLIC PANEL
ALBUMIN: 1.6 g/dL — AB (ref 3.5–5.0)
ALK PHOS: 118 U/L (ref 38–126)
ALT: 61 U/L — ABNORMAL HIGH (ref 14–54)
ANION GAP: 24 — AB (ref 5–15)
AST: 102 U/L — ABNORMAL HIGH (ref 15–41)
BUN: 18 mg/dL (ref 6–20)
CALCIUM: 9.4 mg/dL (ref 8.9–10.3)
CO2: 19 mmol/L — AB (ref 22–32)
Chloride: 94 mmol/L — ABNORMAL LOW (ref 101–111)
Creatinine, Ser: 2.17 mg/dL — ABNORMAL HIGH (ref 0.44–1.00)
GFR calc non Af Amer: 22 mL/min — ABNORMAL LOW (ref >60)
GFR, EST AFRICAN AMERICAN: 25 mL/min — AB (ref >60)
GLUCOSE: 451 mg/dL — AB (ref 65–99)
POTASSIUM: 4.4 mmol/L (ref 3.5–5.1)
SODIUM: 137 mmol/L (ref 135–145)
Total Bilirubin: 0.8 mg/dL (ref 0.3–1.2)
Total Protein: 5.2 g/dL — ABNORMAL LOW (ref 6.5–8.1)

## 2016-03-14 LAB — I-STAT CHEM 8, ED
BUN: 22 mg/dL — ABNORMAL HIGH (ref 6–20)
CHLORIDE: 93 mmol/L — AB (ref 101–111)
Calcium, Ion: 1.15 mmol/L (ref 1.12–1.23)
Creatinine, Ser: 1.6 mg/dL — ABNORMAL HIGH (ref 0.44–1.00)
Glucose, Bld: 432 mg/dL — ABNORMAL HIGH (ref 65–99)
HEMATOCRIT: 29 % — AB (ref 36.0–46.0)
HEMOGLOBIN: 9.9 g/dL — AB (ref 12.0–15.0)
POTASSIUM: 4.3 mmol/L (ref 3.5–5.1)
Sodium: 134 mmol/L — ABNORMAL LOW (ref 135–145)
TCO2: 23 mmol/L (ref 0–100)

## 2016-03-14 LAB — I-STAT TROPONIN, ED: TROPONIN I, POC: 0.19 ng/mL — AB (ref 0.00–0.08)

## 2016-03-14 LAB — CBG MONITORING, ED: GLUCOSE-CAPILLARY: 422 mg/dL — AB (ref 65–99)

## 2016-03-14 MED ORDER — SODIUM BICARBONATE 8.4 % IV SOLN
INTRAVENOUS | Status: DC
  Filled 2016-03-14 (×3): qty 850

## 2016-03-14 MED ORDER — SODIUM CHLORIDE 0.9 % IV SOLN
1000.0000 mL | Freq: Once | INTRAVENOUS | Status: AC
  Administered 2016-03-14: 1000 mL via INTRAVENOUS

## 2016-03-14 MED ORDER — VANCOMYCIN HCL 10 G IV SOLR
1250.0000 mg | Freq: Once | INTRAVENOUS | Status: DC
  Filled 2016-03-14: qty 1250

## 2016-03-14 MED ORDER — EPINEPHRINE HCL 0.1 MG/ML IJ SOSY
PREFILLED_SYRINGE | INTRAMUSCULAR | Status: AC | PRN
  Administered 2016-03-14: 1 mg via INTRAVENOUS

## 2016-03-14 MED ORDER — CALCIUM CHLORIDE 10 % IV SOLN
INTRAVENOUS | Status: AC | PRN
  Administered 2016-03-14: 1 g via INTRAVENOUS

## 2016-03-14 MED ORDER — EPINEPHRINE HCL 1 MG/ML IJ SOLN
0.5000 ug/min | INTRAVENOUS | Status: DC
  Administered 2016-03-14: 5 ug/min via INTRAVENOUS
  Filled 2016-03-14: qty 4

## 2016-03-14 MED ORDER — IOPAMIDOL (ISOVUE-370) INJECTION 76%
INTRAVENOUS | Status: AC
  Filled 2016-03-14: qty 100

## 2016-03-14 MED ORDER — CEFEPIME HCL 2 G IJ SOLR
2.0000 g | Freq: Once | INTRAMUSCULAR | Status: DC
Start: 2016-03-14 — End: 2016-03-14

## 2016-03-14 MED ORDER — SODIUM BICARBONATE 8.4 % IV SOLN
INTRAVENOUS | Status: AC | PRN
  Administered 2016-03-14 (×3): 50 meq via INTRAVENOUS

## 2016-03-14 MED ORDER — SODIUM BICARBONATE 8.4 % IV SOLN
INTRAVENOUS | Status: AC | PRN
  Administered 2016-03-14: 50 meq via INTRAVENOUS

## 2016-03-14 MED FILL — Medication: Qty: 1 | Status: AC

## 2016-03-15 LAB — BLOOD CULTURE ID PANEL (REFLEXED)
Acinetobacter baumannii: NOT DETECTED
CANDIDA ALBICANS: NOT DETECTED
CANDIDA GLABRATA: NOT DETECTED
CANDIDA PARAPSILOSIS: NOT DETECTED
CANDIDA TROPICALIS: NOT DETECTED
Candida krusei: NOT DETECTED
Enterobacter cloacae complex: NOT DETECTED
Enterobacteriaceae species: NOT DETECTED
Enterococcus species: NOT DETECTED
Escherichia coli: NOT DETECTED
HAEMOPHILUS INFLUENZAE: NOT DETECTED
KLEBSIELLA OXYTOCA: NOT DETECTED
KLEBSIELLA PNEUMONIAE: NOT DETECTED
Listeria monocytogenes: NOT DETECTED
METHICILLIN RESISTANCE: NOT DETECTED
NEISSERIA MENINGITIDIS: NOT DETECTED
PROTEUS SPECIES: NOT DETECTED
Pseudomonas aeruginosa: NOT DETECTED
STAPHYLOCOCCUS SPECIES: DETECTED — AB
STREPTOCOCCUS PYOGENES: NOT DETECTED
STREPTOCOCCUS SPECIES: NOT DETECTED
Serratia marcescens: NOT DETECTED
Staphylococcus aureus (BCID): NOT DETECTED
Streptococcus agalactiae: NOT DETECTED
Streptococcus pneumoniae: NOT DETECTED

## 2016-03-17 LAB — CULTURE, BLOOD (ROUTINE X 2)

## 2016-03-19 LAB — CULTURE, BLOOD (ROUTINE X 2): Culture: NO GROWTH

## 2016-03-22 ENCOUNTER — Telehealth: Payer: Self-pay | Admitting: Endocrinology

## 2016-03-22 NOTE — Telephone Encounter (Signed)
ER called, and I declined to sign.  I don't know what the status of this is

## 2016-03-22 NOTE — Telephone Encounter (Signed)
See message and please advise, Thanks!  

## 2016-03-22 NOTE — Telephone Encounter (Signed)
Patient son is calling on the status of patient death certificate. Please advise

## 2016-03-23 ENCOUNTER — Ambulatory Visit: Payer: Medicare Other | Admitting: Endocrinology

## 2016-03-23 NOTE — Telephone Encounter (Signed)
Requested a call back from the patients son to discuss.

## 2016-03-25 NOTE — ED Notes (Signed)
Per dr. Alvino Chapel Hold on other medications until family comes to bedside and he talks with them.

## 2016-03-25 NOTE — ED Notes (Signed)
xray called for stat portable.

## 2016-03-25 NOTE — Code Documentation (Signed)
CRP in progress on arrival with lucas

## 2016-03-25 NOTE — Procedures (Signed)
Extubation Procedure Note  Patient Details:   Name: SHARNIECE BELLINGER DOB: [birth year] MRN: [id number]   Airway Documentation:     Evaluation  O2 sats: pt expired Complications: No apparent complications Patient did tolerate procedure well. Bilateral Breath Sounds: Clear   No   Pt extubated per MD order after pt expired. RT will continue to monitor.   Laymond Purser M 03-30-16, 10:09 AM

## 2016-03-25 NOTE — Progress Notes (Signed)
   03/31/16 0951  Clinical Encounter Type  Visited With Patient and family together;Health care provider  Visit Type Initial;ED;Death;Critical Care  Referral From Nurse;Chaplain  Spiritual Encounters  Spiritual Needs Prayer;Emotional;Grief support  Stress Factors  Family Stress Factors Loss;Health changes   Chaplain responded to support the patient's family as patient transitioned towards death. Chaplain offered prayer and support. Chaplain extended hospitality. Chaplain passed along patient's next of kin information to patient's RN Genevy Juliana, husband, [phone], [phone]). Chaplain gave patient's family information about patient placement department. Spiritual care services available as needed.   Jeri Lager, Chaplain 03-31-2016 9:54 AM

## 2016-03-25 NOTE — ED Notes (Signed)
Per md Pickering we have stopped all drips and family is a at bedside with [place].

## 2016-03-25 NOTE — ED Notes (Signed)
Inserted foley due to pt being critical, post arrest. Insertion was unsucessful due to no urine return. Pt started to code when removing foley. No foley insertion now, due to pt now being DNR

## 2016-03-25 NOTE — ED Provider Notes (Signed)
[facility] DEPT Provider Note   CSN: [id number] Arrival date & time: 19-Mar-2016  0757     History   Chief Complaint Chief Complaint  Patient presents with  . Cardiac Arrest    HPI Lindsey Jenkins is a 75 y.o. female.  HPI  This is a patient with extensive comorbidities, including HTN, HLD, Dm, who had a recent  Hospitalization for a hernia repair, and then developed a pneumonia, who presents in cardiac arrest.  The family reports she had been SOB for a few days.  This morning, they heard her fall in the shower this morning.  She was unresponsive and pulseless, and bystander initiated CPR.  EMS continued CPR.   Total down time for EMS was about one hour. They had PEA and asystole for rhythms, and had occasional ROSC.  At time of arrival, CPR had been ongoing for 10 minutes.  Past Medical History:  Diagnosis Date  . Arthritis    hands, feet, ? type of arthritis, no treatment yet   . BACK PAIN, LUMBAR 05/21/2009   pt. reports bulging disc in lumbar region, since 2015  . Cataracts, bilateral   . 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 DIABETES MELLITUS, TYPE II 04/19/2007  . DIVERTICULOSIS, COLON 07/20/2006  . GASTROESOPHAGEAL REFLUX DISEASE 08/21/2007  . HIP PAIN 05/24/2010  . HYPERLIPIDEMIA 04/19/2007  . HYPERTENSION 04/19/2007  . MENOPAUSAL SYNDROME 04/19/2007  . OSTEOPOROSIS 04/19/2007    Patient Active Problem List   Diagnosis Date Noted  . 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 Bilateral pneumonia   . Abdominal distension   . Right middle lobe pneumonia   . HCAP (healthcare-associated pneumonia) 02/27/2016  . Tachycardia   . Ventral hernia without obstruction or gangrene 02/17/2016  . Multinodular goiter 06/22/2015  . Encounter for long-term (current) use of other medications 05/30/2013  . 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 Left wrist pain 08/29/2011  . Neck pain 05/29/2011  . Cellulitis of leg, left 02/16/2011  . HIP PAIN 05/24/2010  . BACK PAIN,  LUMBAR 05/21/2009  . 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 GASTROESOPHAGEAL REFLUX DISEASE 08/21/2007  . Diabetes ([place]) 04/19/2007  . Dyslipidemia 04/19/2007  . HTN (hypertension) 04/19/2007  . MENOPAUSAL SYNDROME 04/19/2007  . Osteoporosis 04/19/2007  . DIVERTICULOSIS, COLON 07/20/2006    Past Surgical History:  Procedure Laterality Date  . BREAST BIOPSY  1990's   benign  . INSERTION OF MESH N/A 02/17/2016   Procedure: INSERTION OF MESH;  Surgeon: Autumn Messing III, MD;  Location: [facility];  Service: General;  Laterality: N/A;  . TUBAL LIGATION    . VENTRAL HERNIA REPAIR  02/17/2016   LAPAROSCOPIC VENTRAL HERNIA REPAIR WITH MESH (N/A)  . VENTRAL HERNIA REPAIR N/A 02/17/2016   Procedure: LAPAROSCOPIC VENTRAL HERNIA REPAIR WITH MESH;  Surgeon: Autumn Messing III, MD;  Location: [facility];  Service: General;  Laterality: N/A;    OB History    No data available       Home Medications    Prior to Admission medications   Medication Sig Start Date End Date Taking? Authorizing Provider  amoxicillin-clavulanate (AUGMENTIN) 875-125 MG tablet Take 1 tablet by mouth 2 (two) times daily. 03/03/16   Autumn Messing III, MD  aspirin 325 MG tablet Take 325 mg by mouth daily.      Historical Provider, MD  colestipol (COLESTID) 1 g tablet Take 3 tablets (3 g total) by mouth daily. Patient taking differently: Take 3 g by mouth at bedtime.  09/22/15  Renato Shin, MD  glimepiride (AMARYL) 1 MG tablet Take 0.5 tablets (0.5 mg total) by mouth daily with breakfast. 12/22/15   Renato Shin, MD  glucose blood (ACCU-CHEK COMPACT TEST DRUM) test strip Two times a day dx E11.9 02/12/16   Renato Shin, MD  ibuprofen (ADVIL,MOTRIN) 200 MG tablet Take 400 mg by mouth every 6 (six) hours as needed.    Historical Provider, MD  JANUVIA 100 MG tablet take 1 tablet by mouth once daily Patient taking differently: take 1 tablet by mouth once daily    takes at bedtime 01/25/16   Renato Shin, MD  Lancets (ACCU-CHEK MULTICLIX) lancets  Two times a day dx E11.9 02/12/16   Renato Shin, MD  lisinopril (PRINIVIL,ZESTRIL) 40 MG tablet take 1 tablet by mouth once daily Patient taking differently: take 1 tablet by mouth once daily    takes at bedtime 07/30/15   Renato Shin, MD  metFORMIN (GLUCOPHAGE) 500 MG tablet take 2 tablets by mouth twice a day with meals 08/19/15   Renato Shin, MD  NIFEdipine (NIFEDICAL XL) 30 MG 24 hr tablet Take 1 tablet (30 mg total) by mouth daily. Patient taking differently: Take 30 mg by mouth at bedtime.  06/30/15   Renato Shin, MD  oxyCODONE-acetaminophen (ROXICET) 5-325 MG tablet Take 1-2 tablets by mouth every 4 (four) hours as needed. 02/17/16   Autumn Messing III, MD  simvastatin (ZOCOR) 40 MG tablet take 2 tablets by mouth at bedtime 07/29/15   Renato Shin, MD  traMADol Veatrice Bourbon) 50 MG tablet take 1 tablet by mouth every 4 hours if needed for pain 08/19/15   Renato Shin, MD    Family History Family History  Problem Relation Age of Onset  . Heart attack Mother   . Cancer Neg Hx     Social History Social History  Substance Use Topics  . Smoking status: Never Smoker  . Smokeless tobacco: Never Used  . Alcohol use Yes     Comment: occasionally, not regular     Allergies   No known allergies   Review of Systems Review of Systems  Unable to perform ROS: Patient unresponsive     Physical Exam Updated Vital Signs BP (!) 46/32   Pulse (!) 0   Resp 22   Ht 5\' 5"  (1.651 m)   Wt 72.6 kg   SpO2 100%   BMI 26.63 kg/m   Physical Exam  Constitutional: She appears distressed. She is intubated. Cervical collar and backboard in place.  Obese   HENT:  Head: Normocephalic and atraumatic.  Eyes: Right pupil is not reactive. Right pupil is round. Left pupil is not reactive. Left pupil is round. Pupils are equal.  Neck: Trachea normal. No JVD present.  Cardiovascular: Normal heart sounds and intact distal pulses.  Tachycardia present.   Pulmonary/Chest: Apnea noted. She is intubated. She is in  respiratory distress. She has rhonchi.  Abdominal: Soft. Bowel sounds are normal.  Protuberant Well appearing post-surgical scars  Musculoskeletal: She exhibits no edema or deformity.  Neurological: She is unresponsive. GCS eye subscore is 1. GCS verbal subscore is 1. GCS motor subscore is 1.  No pupillary reflex, no gag reflex  Skin: Skin is dry.     ED Treatments / Results  Labs (all labs ordered are listed, but only abnormal results are displayed) Labs Reviewed  COMPREHENSIVE METABOLIC PANEL - Abnormal; Notable for the following:       Result Value   Chloride 94 (*)    CO2 19 (*)  Glucose, Bld 451 (*)    Creatinine, Ser 2.17 (*)    Total Protein 5.2 (*)    Albumin 1.6 (*)    AST 102 (*)    ALT 61 (*)    GFR calc non Af Amer 22 (*)    GFR calc Af Amer 25 (*)    Anion gap 24 (*)    All other components within normal limits  CBC WITH DIFFERENTIAL/PLATELET - Abnormal; Notable for the following:    WBC 18.0 (*)    RBC 3.54 (*)    Hemoglobin 8.9 (*)    HCT 28.8 (*)    MCH 25.1 (*)    Neutro Abs 12.2 (*)    Lymphs Abs 4.7 (*)    Monocytes Absolute 1.1 (*)    All other components within normal limits  I-STAT CHEM 8, ED - Abnormal; Notable for the following:    Sodium 134 (*)    Chloride 93 (*)    BUN 22 (*)    Creatinine, Ser 1.60 (*)    Glucose, Bld 432 (*)    Hemoglobin 9.9 (*)    HCT 29.0 (*)    All other components within normal limits  I-STAT TROPOININ, ED - Abnormal; Notable for the following:    Troponin i, poc 0.19 (*)    All other components within normal limits  I-STAT CG4 LACTIC ACID, ED - Abnormal; Notable for the following:    Lactic Acid, Venous 15.63 (*)    All other components within normal limits  CBG MONITORING, ED - Abnormal; Notable for the following:    Glucose-Capillary 422 (*)    All other components within normal limits  URINE CULTURE  CULTURE, BLOOD (ROUTINE X 2)  CULTURE, BLOOD (ROUTINE X 2)  URINALYSIS, ROUTINE W REFLEX MICROSCOPIC  (NOT AT [facility])    EKG  EKG Interpretation None       Radiology Dg Chest Portable 1 View  Result Date: 03-18-16 CLINICAL DATA:  Endotracheal tube placement. Cardiopulmonary resuscitation. EXAM: PORTABLE CHEST 1 VIEW COMPARISON:  02/28/2016 FINDINGS: An endotracheal tube is been placed and is 1.7 above the carina. The nasogastric tube is no longer present. Mild enlargement of the cardiopericardial silhouette with indistinct pulmonary vasculature, and bilateral perihilar and basilar airspace opacity slightly more prominent on the left than the right. Mild blunting of the costophrenic angles, questionable fluid in the right minor fissure. No pneumothorax. Mild thoracic spondylosis. Atherosclerotic calcification of the aortic arch. Linear lucency projects over the right cardiac shadow and is probably artifactual given the regularity. IMPRESSION: 1. Endotracheal tube tip is 1.7 cm above the carina. 2. Mild cardiomegaly with blunted bilateral costophrenic angles, indistinct pulmonary vasculature, and asymmetric airspace opacity to the left, suspicious for pulmonary edema/CHF. Potential small bilateral pleural effusions with bibasilar atelectasis. 3. Atherosclerotic aortic arch. 4. Linear lucency projecting over the right cardiac shadow is very regular in contour and accordingly thought to be artifact or artificial rather than being due to pneumomediastinum. There is no pneumothorax. Electronically Signed   By: Van Clines M.D.   On: March 18, 2016 08:47    Procedures Procedures (including critical care time)  Medications Ordered in ED Medications  EPINEPHrine (ADRENALIN) 4 mg in dextrose 5 % 250 mL (0.016 mg/mL) infusion (5 mcg/min Intravenous New Bag/Given 2016/03/18 0819)  sodium bicarbonate 150 mEq in sterile water 1,000 mL infusion (not administered)  vancomycin (VANCOCIN) 1,250 mg in sodium chloride 0.9 % 250 mL IVPB (not administered)  ceFEPIme (MAXIPIME) 2 g in dextrose 5 %  50 mL IVPB (not  administered)  sodium bicarbonate injection (50 mEq Intravenous Given 2016/03/20 0807)  calcium chloride injection (1 g Intravenous Given 03/20/2016 0757)  0.9 %  sodium chloride infusion (1,000 mLs Intravenous New Bag/Given 03-20-2016 0815)    Followed by  0.9 %  sodium chloride infusion (1,000 mLs Intravenous New Bag/Given Mar 20, 2016 0815)  EPINEPHrine (ADRENALIN) 0.1 MG/ML injection (1 mg Intravenous Given 03/20/16 0821)  sodium bicarbonate injection (50 mEq Intravenous Given 03-20-2016 0823)  EPINEPHrine (ADRENALIN) 0.1 MG/ML injection (1 mg Intravenous Given March 20, 2016 0841)     Initial Impression / Assessment and Plan / ED Course  I have reviewed the triage vital signs and the nursing notes.  Pertinent labs & imaging results that were available during my care of the patient were reviewed by me and considered in my medical decision making (see chart for details).  Clinical Course    Patient arrived with active compressions with [facility] device.  She had a down time of an hour prior to arrival.  Rhythm was PEA and asystole for EMS.  Upon arrival here, she was given Ca, Bicarb, Epi and continued ACLS.  ETT was confirmed with bilateral breath sounds and adequate ETCO2.  Patient regained pulses.  She was noted to be hypotensive, so push dose Epi was given and an epi drip was started.  Labs taken.    Her lactate was 15, which is very elevated, she had a marked leukocytosis, troponin was elevated and Cr elevated.  CXR showed ETT in good position, and patchy infiltrates L>R.  Antibiotics ordered.  Fluids given.  She lost pulses again and ACLS was initiated.  Rhythm was PEA.  ROSC achieved again.  Family updated.  She lost pulses again and ACLS was initiated, with ROSC afterwards.  Her exam showed GCS3, no pupillary reflex, no gag reflex.  She has had a prolonged downtime and with markedly abnormal labs.  It is highly unlikely this patient would have a positive neurologic outcome.  Family was brought into the room.   She lost pulses again, and the decision was made to not initiate ACLS.  Time of death was 34.  Family notified.  Final Clinical Impressions(s) / ED Diagnoses   Final diagnoses:  Cardiac arrest [facility])    New Prescriptions New Prescriptions   No medications on file     Levada Schilling, MD 03-20-16 [place], MD 03/16/16 1025

## 2016-03-25 NOTE — ED Notes (Signed)
49mcg of epi pushed by Dr.Pickering.

## 2016-03-25 NOTE — ED Notes (Signed)
Family at bedside. 

## 2016-03-25 NOTE — ED Notes (Signed)
PT does not have a pulse at this time. MD pickering checking cardiac activity with [place]. No cardiac activity present. Time of death 55.

## 2016-03-25 NOTE — Code Documentation (Signed)
Pts heart rate dropped from 120 ST to PEA. CPR restarted.

## 2016-03-25 NOTE — Progress Notes (Signed)
   04-08-16 0838  Clinical Encounter Type  Visited With Family  Visit Type ED  Referral From Nurse  Consult/Referral To Chaplain  Spiritual Encounters  Spiritual Needs Emotional  Stress Factors  Family Stress Factors Health changes;Lack of knowledge  Chaplain responded to page, patient unable to respond, receiving treatment, family escorted to consult room, provided support during MD consult, provided emotional support, spiritual presence and hospitality. Chaplain will be available for further support as needed.

## 2016-03-25 NOTE — ED Notes (Signed)
Family at bedside with [facility]

## 2016-03-25 NOTE — Progress Notes (Signed)
Patient ventilator was placed on standby by MD.  Disconnected patient from ventilator due to patient passing.

## 2016-03-25 NOTE — Code Documentation (Signed)
Pulse check-134 sinus tach.

## 2016-03-25 DEATH — deceased

## 2018-04-17 IMAGING — CR DG CHEST 1V PORT
1 series · 1 of 1 positions shown · non-contrast
Comparison: Chest radiograph dated [DATE] 7

CLINICAL DATA: 74-year-old female pneumonia

EXAM:
PORTABLE CHEST 1 VIEW

[AP]
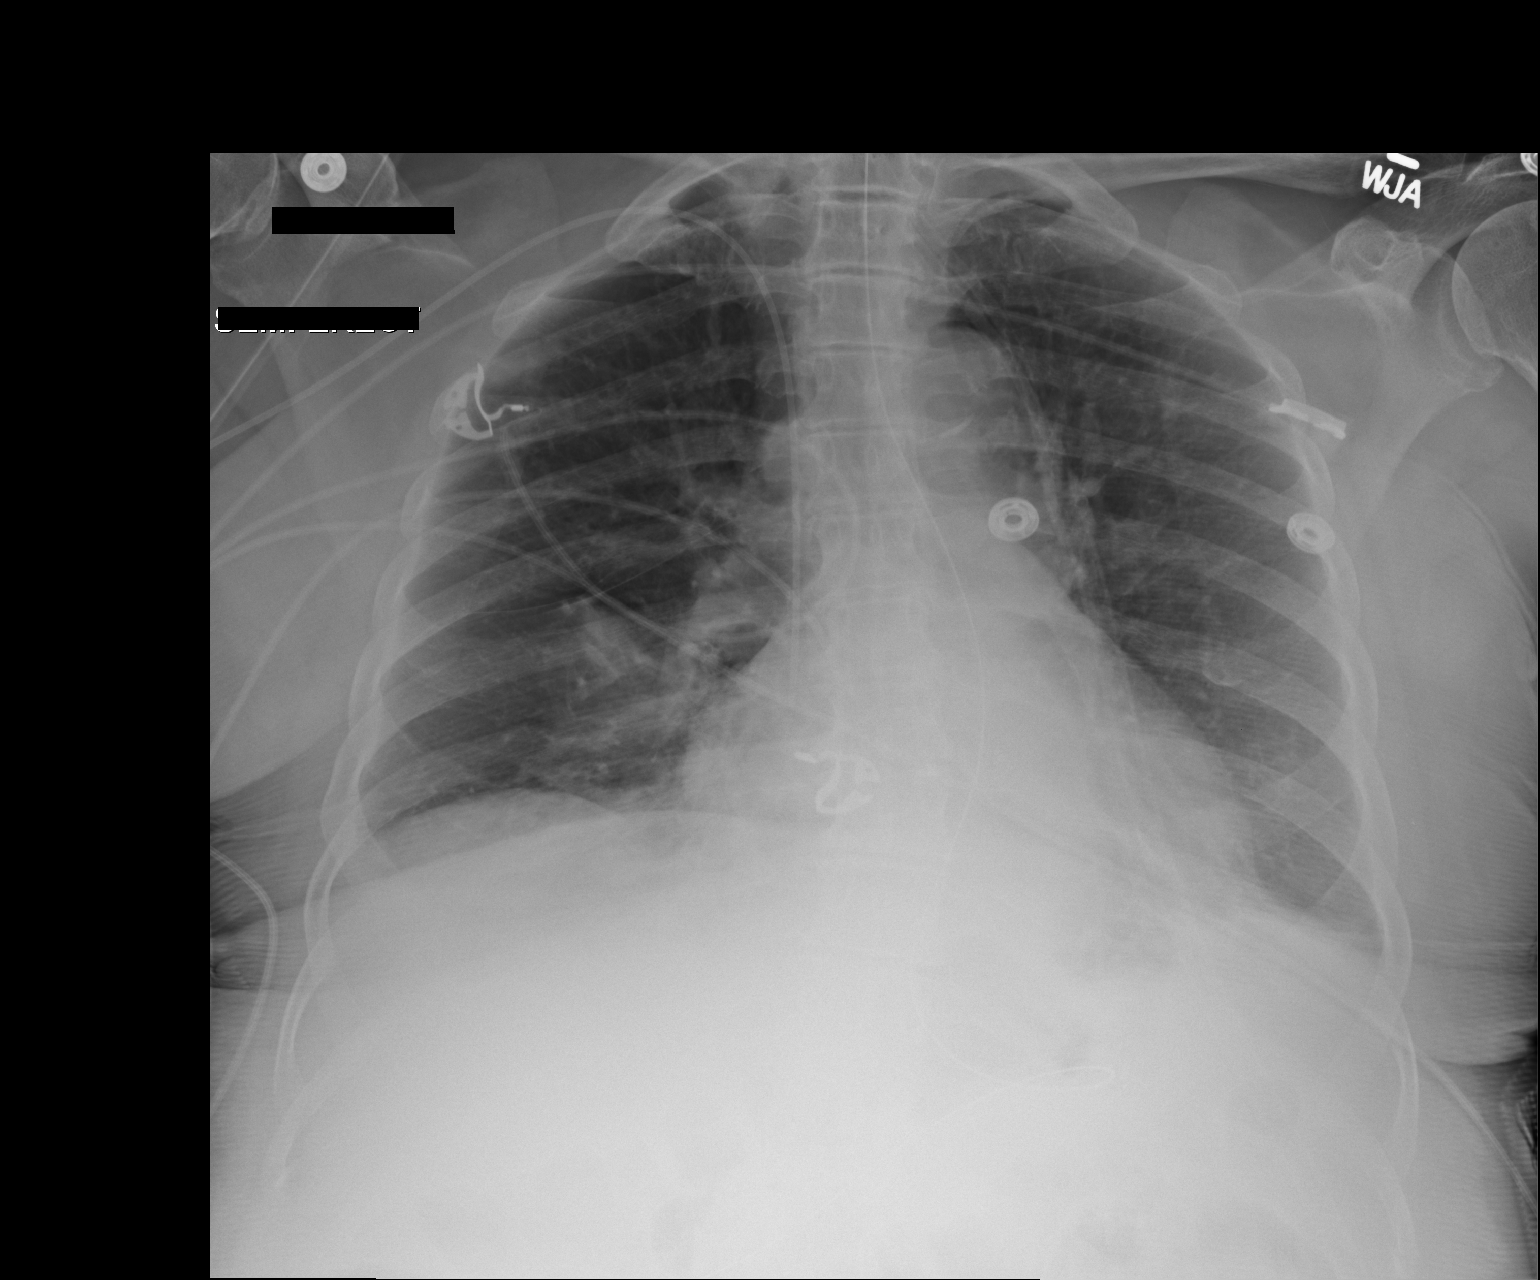

[1 of 1 positions shown; findings below may reference images not displayed]

FINDINGS: Single portable view of the chest demonstrate interval improvement
of the previously seen right lung opacity. There is residual
infiltrate in the right infrahilar region. Left lung base
atelectatic changes noted. There is no pleural effusion or
pneumothorax. The cardiac silhouette is within normal limits. Right
sided PICC with tip over right atrium. Enteric tube with tip in the
mid stomach.
IMPRESSION: Interval improvement of the right infrahilar opacity with small
residual infiltrate. Follow-up recommended.
